# Patient Record
Sex: Male | Born: 1935 | Race: White | Hispanic: No | Smoking: Former smoker
Health system: Southern US, Community
[De-identification: ages and names within clinical notes are randomized; demographics above are authoritative.]

## PROBLEM LIST (undated history)

## (undated) DIAGNOSIS — Z950 Presence of cardiac pacemaker: Secondary | ICD-10-CM

## (undated) DIAGNOSIS — Z5189 Encounter for other specified aftercare: Secondary | ICD-10-CM

## (undated) DIAGNOSIS — N289 Disorder of kidney and ureter, unspecified: Secondary | ICD-10-CM

## (undated) DIAGNOSIS — E119 Type 2 diabetes mellitus without complications: Secondary | ICD-10-CM

## (undated) DIAGNOSIS — I10 Essential (primary) hypertension: Secondary | ICD-10-CM

## (undated) DIAGNOSIS — C801 Malignant (primary) neoplasm, unspecified: Secondary | ICD-10-CM

## (undated) DIAGNOSIS — D649 Anemia, unspecified: Secondary | ICD-10-CM

## (undated) DIAGNOSIS — E785 Hyperlipidemia, unspecified: Secondary | ICD-10-CM

## (undated) HISTORY — DX: Presence of cardiac pacemaker: Z95.0

## (undated) HISTORY — PX: PROSTATECTOMY: SHX69

## (undated) HISTORY — PX: CARDIAC VALVE REPLACEMENT: SHX585

---

## 2005-03-16 ENCOUNTER — Ambulatory Visit: Payer: Self-pay | Admitting: Gastroenterology

## 2005-03-30 ENCOUNTER — Other Ambulatory Visit: Payer: Self-pay

## 2005-03-30 ENCOUNTER — Inpatient Hospital Stay: Payer: Self-pay | Admitting: Internal Medicine

## 2005-03-31 ENCOUNTER — Inpatient Hospital Stay: Payer: Self-pay | Admitting: Gastroenterology

## 2005-04-21 ENCOUNTER — Ambulatory Visit: Payer: Self-pay | Admitting: Gastroenterology

## 2006-06-03 ENCOUNTER — Ambulatory Visit: Payer: Self-pay | Admitting: Internal Medicine

## 2006-06-23 ENCOUNTER — Ambulatory Visit: Payer: Self-pay | Admitting: Internal Medicine

## 2006-08-30 ENCOUNTER — Ambulatory Visit: Payer: Self-pay | Admitting: Internal Medicine

## 2006-09-23 ENCOUNTER — Ambulatory Visit: Payer: Self-pay | Admitting: Internal Medicine

## 2006-11-08 ENCOUNTER — Ambulatory Visit: Payer: Self-pay | Admitting: Internal Medicine

## 2006-11-22 ENCOUNTER — Ambulatory Visit: Payer: Self-pay | Admitting: Internal Medicine

## 2007-06-27 ENCOUNTER — Ambulatory Visit: Payer: Self-pay | Admitting: Internal Medicine

## 2007-07-24 ENCOUNTER — Ambulatory Visit: Payer: Self-pay | Admitting: Internal Medicine

## 2007-08-11 ENCOUNTER — Ambulatory Visit: Payer: Self-pay | Admitting: Gastroenterology

## 2007-10-22 ENCOUNTER — Ambulatory Visit: Payer: Self-pay | Admitting: Internal Medicine

## 2007-12-22 ENCOUNTER — Ambulatory Visit: Payer: Self-pay | Admitting: Internal Medicine

## 2007-12-26 ENCOUNTER — Ambulatory Visit: Payer: Self-pay | Admitting: Internal Medicine

## 2008-01-22 ENCOUNTER — Ambulatory Visit: Payer: Self-pay | Admitting: Internal Medicine

## 2008-03-23 ENCOUNTER — Ambulatory Visit: Payer: Self-pay | Admitting: Internal Medicine

## 2008-09-24 ENCOUNTER — Ambulatory Visit: Payer: Self-pay | Admitting: Internal Medicine

## 2008-10-21 ENCOUNTER — Ambulatory Visit: Payer: Self-pay | Admitting: Internal Medicine

## 2009-03-23 ENCOUNTER — Ambulatory Visit: Payer: Self-pay | Admitting: Internal Medicine

## 2009-03-25 ENCOUNTER — Ambulatory Visit: Payer: Self-pay | Admitting: Internal Medicine

## 2009-04-23 ENCOUNTER — Ambulatory Visit: Payer: Self-pay

## 2009-04-23 ENCOUNTER — Ambulatory Visit: Payer: Self-pay | Admitting: Internal Medicine

## 2009-05-14 ENCOUNTER — Ambulatory Visit: Payer: Self-pay | Admitting: Gastroenterology

## 2009-05-23 ENCOUNTER — Ambulatory Visit: Payer: Self-pay | Admitting: Internal Medicine

## 2009-06-23 ENCOUNTER — Ambulatory Visit: Payer: Self-pay | Admitting: Internal Medicine

## 2009-07-14 ENCOUNTER — Ambulatory Visit: Payer: Self-pay | Admitting: Internal Medicine

## 2009-07-23 ENCOUNTER — Ambulatory Visit: Payer: Self-pay | Admitting: Internal Medicine

## 2009-08-23 ENCOUNTER — Ambulatory Visit: Payer: Self-pay | Admitting: Internal Medicine

## 2009-09-23 ENCOUNTER — Ambulatory Visit: Payer: Self-pay | Admitting: Internal Medicine

## 2009-10-21 ENCOUNTER — Ambulatory Visit: Payer: Self-pay | Admitting: Internal Medicine

## 2009-11-21 ENCOUNTER — Ambulatory Visit: Payer: Self-pay | Admitting: Internal Medicine

## 2009-12-21 ENCOUNTER — Ambulatory Visit: Payer: Self-pay | Admitting: Internal Medicine

## 2010-01-21 ENCOUNTER — Ambulatory Visit: Payer: Self-pay | Admitting: Internal Medicine

## 2010-02-20 ENCOUNTER — Ambulatory Visit: Payer: Self-pay | Admitting: Internal Medicine

## 2010-03-23 ENCOUNTER — Ambulatory Visit: Payer: Self-pay | Admitting: Internal Medicine

## 2010-04-23 ENCOUNTER — Ambulatory Visit: Payer: Self-pay | Admitting: Internal Medicine

## 2010-05-23 ENCOUNTER — Ambulatory Visit: Payer: Self-pay | Admitting: Internal Medicine

## 2010-06-17 ENCOUNTER — Ambulatory Visit: Payer: Self-pay | Admitting: Gastroenterology

## 2010-06-23 ENCOUNTER — Ambulatory Visit: Payer: Self-pay | Admitting: Internal Medicine

## 2010-07-23 ENCOUNTER — Ambulatory Visit: Payer: Self-pay | Admitting: Internal Medicine

## 2010-07-24 ENCOUNTER — Ambulatory Visit: Payer: Self-pay | Admitting: Internal Medicine

## 2010-08-23 ENCOUNTER — Ambulatory Visit: Payer: Self-pay | Admitting: Internal Medicine

## 2010-09-23 ENCOUNTER — Ambulatory Visit: Payer: Self-pay | Admitting: Internal Medicine

## 2010-10-22 ENCOUNTER — Ambulatory Visit: Payer: Self-pay | Admitting: Internal Medicine

## 2010-11-22 ENCOUNTER — Ambulatory Visit: Payer: Self-pay | Admitting: Internal Medicine

## 2010-12-22 ENCOUNTER — Ambulatory Visit: Payer: Self-pay | Admitting: Internal Medicine

## 2011-01-22 ENCOUNTER — Ambulatory Visit: Payer: Self-pay | Admitting: Internal Medicine

## 2011-03-17 ENCOUNTER — Ambulatory Visit: Payer: Self-pay | Admitting: Internal Medicine

## 2011-03-24 ENCOUNTER — Ambulatory Visit: Payer: Self-pay | Admitting: Internal Medicine

## 2011-05-12 ENCOUNTER — Ambulatory Visit: Payer: Self-pay | Admitting: Internal Medicine

## 2011-05-24 ENCOUNTER — Ambulatory Visit: Payer: Self-pay | Admitting: Internal Medicine

## 2011-06-08 ENCOUNTER — Observation Stay: Payer: Self-pay | Admitting: Internal Medicine

## 2011-07-07 ENCOUNTER — Ambulatory Visit: Payer: Self-pay | Admitting: Internal Medicine

## 2011-07-24 ENCOUNTER — Ambulatory Visit: Payer: Self-pay | Admitting: Internal Medicine

## 2011-09-01 ENCOUNTER — Ambulatory Visit: Payer: Self-pay | Admitting: Internal Medicine

## 2011-09-01 LAB — CBC CANCER CENTER
Basophil #: 0 x10 3/mm (ref 0.0–0.1)
Eosinophil #: 0.2 x10 3/mm (ref 0.0–0.7)
Eosinophil %: 3.8 %
Lymphocyte %: 22.4 %
MCHC: 32.6 g/dL (ref 32.0–36.0)
MCV: 88 fL (ref 80–100)
Neutrophil %: 64 %
RBC: 3.31 10*6/uL — ABNORMAL LOW (ref 4.40–5.90)
RDW: 15 % — ABNORMAL HIGH (ref 11.5–14.5)

## 2011-09-01 LAB — IRON AND TIBC
Iron Bind.Cap.(Total): 345 ug/dL (ref 250–450)
Iron: 47 ug/dL — ABNORMAL LOW (ref 65–175)
Unbound Iron-Bind.Cap.: 298 ug/dL

## 2011-09-24 ENCOUNTER — Ambulatory Visit: Payer: Self-pay | Admitting: Internal Medicine

## 2011-12-27 ENCOUNTER — Ambulatory Visit: Payer: Self-pay | Admitting: Internal Medicine

## 2011-12-27 LAB — CBC CANCER CENTER
Basophil #: 0 x10 3/mm (ref 0.0–0.1)
Basophil %: 0.9 %
Eosinophil #: 0.3 x10 3/mm (ref 0.0–0.7)
Eosinophil %: 6.9 %
HCT: 29 % — ABNORMAL LOW (ref 40.0–52.0)
Lymphocyte #: 0.6 x10 3/mm — ABNORMAL LOW (ref 1.0–3.6)
MCHC: 32.1 g/dL (ref 32.0–36.0)
MCV: 91 fL (ref 80–100)
Monocyte %: 9.3 %
Platelet: 100 x10 3/mm — ABNORMAL LOW (ref 150–440)
RBC: 3.19 10*6/uL — ABNORMAL LOW (ref 4.40–5.90)
RDW: 15.6 % — ABNORMAL HIGH (ref 11.5–14.5)
WBC: 4 x10 3/mm (ref 3.8–10.6)

## 2011-12-27 LAB — IRON AND TIBC
Iron Bind.Cap.(Total): 313 ug/dL (ref 250–450)
Iron Saturation: 22 %
Iron: 68 ug/dL (ref 65–175)

## 2011-12-27 LAB — FERRITIN: Ferritin (ARMC): 23 ng/mL (ref 8–388)

## 2012-01-22 ENCOUNTER — Ambulatory Visit: Payer: Self-pay | Admitting: Internal Medicine

## 2012-02-21 ENCOUNTER — Ambulatory Visit: Payer: Self-pay | Admitting: Internal Medicine

## 2012-02-21 LAB — CANCER CENTER HEMOGLOBIN: HGB: 9.1 g/dL — ABNORMAL LOW (ref 13.0–18.0)

## 2012-03-23 ENCOUNTER — Ambulatory Visit: Payer: Self-pay | Admitting: Internal Medicine

## 2012-04-20 LAB — CANCER CENTER HEMOGLOBIN: HGB: 8.2 g/dL — ABNORMAL LOW (ref 13.0–18.0)

## 2012-04-23 ENCOUNTER — Ambulatory Visit: Payer: Self-pay | Admitting: Internal Medicine

## 2012-04-25 LAB — IRON AND TIBC
Iron Bind.Cap.(Total): 380 ug/dL (ref 250–450)
Iron: 39 ug/dL — ABNORMAL LOW (ref 65–175)

## 2012-04-25 LAB — FERRITIN: Ferritin (ARMC): 6 ng/mL — ABNORMAL LOW (ref 8–388)

## 2012-04-25 LAB — RETICULOCYTES
Absolute Retic Count: 0.0673 10*6/uL (ref 0.031–0.129)
Reticulocyte: 2.04 % (ref 0.7–2.5)

## 2012-05-11 ENCOUNTER — Ambulatory Visit: Payer: Self-pay | Admitting: Gastroenterology

## 2012-05-11 LAB — HEPATIC FUNCTION PANEL A (ARMC)
Alkaline Phosphatase: 41 U/L — ABNORMAL LOW (ref 50–136)
Bilirubin, Direct: 0.2 mg/dL (ref 0.00–0.20)
SGOT(AST): 25 U/L (ref 15–37)

## 2012-05-15 LAB — PATHOLOGY REPORT

## 2012-05-23 ENCOUNTER — Ambulatory Visit: Payer: Self-pay | Admitting: Internal Medicine

## 2012-06-23 ENCOUNTER — Ambulatory Visit: Payer: Self-pay | Admitting: Internal Medicine

## 2012-08-25 ENCOUNTER — Ambulatory Visit: Payer: Self-pay | Admitting: Internal Medicine

## 2012-08-25 LAB — CBC CANCER CENTER
Basophil %: 1.1 %
Eosinophil %: 6.8 %
HCT: 23.8 % — ABNORMAL LOW (ref 40.0–52.0)
Lymphocyte #: 1.1 x10 3/mm (ref 1.0–3.6)
Lymphocyte %: 20.3 %
MCHC: 31.4 g/dL — ABNORMAL LOW (ref 32.0–36.0)
MCV: 79 fL — ABNORMAL LOW (ref 80–100)
Monocyte #: 0.4 x10 3/mm (ref 0.2–1.0)
Neutrophil #: 3.3 x10 3/mm (ref 1.4–6.5)
Neutrophil %: 63.4 %
Platelet: 162 x10 3/mm (ref 150–440)
RDW: 16.1 % — ABNORMAL HIGH (ref 11.5–14.5)
WBC: 5.2 x10 3/mm (ref 3.8–10.6)

## 2012-08-25 LAB — IRON AND TIBC
Iron Bind.Cap.(Total): 413 ug/dL (ref 250–450)
Iron: 20 ug/dL — ABNORMAL LOW (ref 65–175)

## 2012-09-23 ENCOUNTER — Ambulatory Visit: Payer: Self-pay | Admitting: Internal Medicine

## 2012-10-26 ENCOUNTER — Ambulatory Visit: Payer: Self-pay | Admitting: Internal Medicine

## 2012-11-21 ENCOUNTER — Ambulatory Visit: Payer: Self-pay | Admitting: Internal Medicine

## 2012-12-11 LAB — CBC CANCER CENTER
Basophil #: 0 x10 3/mm (ref 0.0–0.1)
Basophil %: 1.2 %
Eosinophil #: 0.2 x10 3/mm (ref 0.0–0.7)
Eosinophil %: 4.4 %
HCT: 32 % — ABNORMAL LOW (ref 40.0–52.0)
HGB: 10.5 g/dL — ABNORMAL LOW (ref 13.0–18.0)
Lymphocyte #: 0.8 x10 3/mm — ABNORMAL LOW (ref 1.0–3.6)
Lymphocyte %: 20.5 %
MCH: 28.2 pg (ref 26.0–34.0)
MCHC: 32.8 g/dL (ref 32.0–36.0)
Monocyte #: 0.4 x10 3/mm (ref 0.2–1.0)
Monocyte %: 9.6 %
Neutrophil %: 64.3 %
RBC: 3.73 10*6/uL — ABNORMAL LOW (ref 4.40–5.90)
RDW: 16.5 % — ABNORMAL HIGH (ref 11.5–14.5)
WBC: 3.7 x10 3/mm — ABNORMAL LOW (ref 3.8–10.6)

## 2012-12-11 LAB — IRON AND TIBC
Iron Bind.Cap.(Total): 350 ug/dL (ref 250–450)
Iron: 45 ug/dL — ABNORMAL LOW (ref 65–175)
Unbound Iron-Bind.Cap.: 305 ug/dL

## 2012-12-11 LAB — FERRITIN: Ferritin (ARMC): 12 ng/mL (ref 8–388)

## 2012-12-21 ENCOUNTER — Ambulatory Visit: Payer: Self-pay | Admitting: Internal Medicine

## 2013-02-01 ENCOUNTER — Inpatient Hospital Stay: Payer: Self-pay | Admitting: Internal Medicine

## 2013-02-01 LAB — URINALYSIS, COMPLETE
Bilirubin,UR: NEGATIVE
Hyaline Cast: 1
Ketone: NEGATIVE
Ph: 5 (ref 4.5–8.0)
Protein: 30
RBC,UR: 3 /HPF (ref 0–5)
Specific Gravity: 1.013 (ref 1.003–1.030)
Squamous Epithelial: 2
WBC UR: 20 /HPF (ref 0–5)

## 2013-02-01 LAB — COMPREHENSIVE METABOLIC PANEL
Albumin: 3 g/dL — ABNORMAL LOW (ref 3.4–5.0)
Alkaline Phosphatase: 68 U/L (ref 50–136)
BUN: 52 mg/dL — ABNORMAL HIGH (ref 7–18)
Bilirubin,Total: 0.3 mg/dL (ref 0.2–1.0)
Calcium, Total: 8.8 mg/dL (ref 8.5–10.1)
Creatinine: 4.22 mg/dL — ABNORMAL HIGH (ref 0.60–1.30)
SGOT(AST): 26 U/L (ref 15–37)
SGPT (ALT): 24 U/L (ref 12–78)
Sodium: 138 mmol/L (ref 136–145)

## 2013-02-01 LAB — CBC
HCT: 35.6 % — ABNORMAL LOW (ref 40.0–52.0)
MCH: 29.9 pg (ref 26.0–34.0)
MCHC: 33.2 g/dL (ref 32.0–36.0)
RBC: 3.96 10*6/uL — ABNORMAL LOW (ref 4.40–5.90)

## 2013-02-01 LAB — LIPASE, BLOOD: Lipase: 185 U/L (ref 73–393)

## 2013-02-01 LAB — HEMOGLOBIN A1C: Hemoglobin A1C: 6.9 % — ABNORMAL HIGH (ref 4.2–6.3)

## 2013-02-01 LAB — TSH: Thyroid Stimulating Horm: 3.27 u[IU]/mL

## 2013-02-02 LAB — CBC WITH DIFFERENTIAL/PLATELET
Eosinophil: 8 %
HGB: 10.3 g/dL — ABNORMAL LOW (ref 13.0–18.0)
Lymphocytes: 16 %
MCH: 30 pg (ref 26.0–34.0)
MCHC: 33.5 g/dL (ref 32.0–36.0)
MCV: 90 fL (ref 80–100)
Platelet: 106 10*3/uL — ABNORMAL LOW (ref 150–440)
RBC: 3.44 10*6/uL — ABNORMAL LOW (ref 4.40–5.90)
Segmented Neutrophils: 62 %

## 2013-02-02 LAB — BASIC METABOLIC PANEL
Calcium, Total: 7.6 mg/dL — ABNORMAL LOW (ref 8.5–10.1)
Chloride: 114 mmol/L — ABNORMAL HIGH (ref 98–107)
Creatinine: 2.43 mg/dL — ABNORMAL HIGH (ref 0.60–1.30)
EGFR (African American): 28 — ABNORMAL LOW
EGFR (Non-African Amer.): 24 — ABNORMAL LOW
Glucose: 78 mg/dL (ref 65–99)
Osmolality: 289 (ref 275–301)
Sodium: 141 mmol/L (ref 136–145)

## 2013-02-03 LAB — CBC WITH DIFFERENTIAL/PLATELET
Basophil #: 0 x10 3/mm 3
Basophil %: 0.1 %
Eosinophil #: 0 x10 3/mm 3
Eosinophil %: 0 %
HCT: 29.9 % — ABNORMAL LOW
HGB: 10.2 g/dL — ABNORMAL LOW
Lymphocyte %: 11.9 %
Lymphs Abs: 1 x10 3/mm 3
MCH: 30 pg
MCHC: 34 g/dL
MCV: 88 fL
Monocyte #: 0.9 "x10 3/mm "
Monocyte %: 11.2 %
Neutrophil #: 6.2 x10 3/mm 3
Neutrophil %: 76.8 %
Platelet: 101 x10 3/mm 3 — ABNORMAL LOW
RBC: 3.39 x10 6/mm 3 — ABNORMAL LOW
RDW: 15.7 % — ABNORMAL HIGH
WBC: 7.8 x10 3/mm 3

## 2013-02-03 LAB — BASIC METABOLIC PANEL WITH GFR
Anion Gap: 8
BUN: 31 mg/dL — ABNORMAL HIGH
Calcium, Total: 7.5 mg/dL — ABNORMAL LOW
Chloride: 109 mmol/L — ABNORMAL HIGH
Co2: 22 mmol/L
Creatinine: 1.85 mg/dL — ABNORMAL HIGH
EGFR (African American): 39 — ABNORMAL LOW
EGFR (Non-African Amer.): 33 — ABNORMAL LOW
Glucose: 97 mg/dL
Osmolality: 284
Potassium: 3.3 mmol/L — ABNORMAL LOW
Sodium: 139 mmol/L

## 2013-02-03 LAB — STOOL CULTURE

## 2013-02-04 LAB — BASIC METABOLIC PANEL
Anion Gap: 7 (ref 7–16)
BUN: 28 mg/dL — ABNORMAL HIGH (ref 7–18)
Chloride: 113 mmol/L — ABNORMAL HIGH (ref 98–107)
Co2: 23 mmol/L (ref 21–32)
EGFR (African American): 45 — ABNORMAL LOW
EGFR (Non-African Amer.): 39 — ABNORMAL LOW
Glucose: 117 mg/dL — ABNORMAL HIGH (ref 65–99)
Osmolality: 291 (ref 275–301)
Potassium: 4.2 mmol/L (ref 3.5–5.1)
Sodium: 143 mmol/L (ref 136–145)

## 2013-02-04 LAB — URINE CULTURE

## 2013-02-20 ENCOUNTER — Ambulatory Visit: Payer: Self-pay | Admitting: Internal Medicine

## 2013-05-08 ENCOUNTER — Ambulatory Visit: Payer: Self-pay | Admitting: Internal Medicine

## 2013-05-08 LAB — CBC CANCER CENTER
Basophil #: 0 x10 3/mm (ref 0.0–0.1)
Basophil %: 1 %
HGB: 6.6 g/dL — ABNORMAL LOW (ref 13.0–18.0)
Lymphocyte #: 0.6 x10 3/mm — ABNORMAL LOW (ref 1.0–3.6)
MCV: 75 fL — ABNORMAL LOW (ref 80–100)
Monocyte #: 0.4 x10 3/mm (ref 0.2–1.0)
Monocyte %: 8.2 %
Neutrophil #: 3.9 x10 3/mm (ref 1.4–6.5)
Neutrophil %: 76.6 %
Platelet: 175 x10 3/mm (ref 150–440)
RBC: 2.82 10*6/uL — ABNORMAL LOW (ref 4.40–5.90)
RDW: 16.7 % — ABNORMAL HIGH (ref 11.5–14.5)
WBC: 5.1 x10 3/mm (ref 3.8–10.6)

## 2013-05-08 LAB — IRON AND TIBC
Iron Bind.Cap.(Total): 390 ug/dL (ref 250–450)
Iron Saturation: 21 %

## 2013-05-08 LAB — FERRITIN: Ferritin (ARMC): 4 ng/mL — ABNORMAL LOW (ref 8–388)

## 2013-05-16 LAB — CANCER CENTER HEMOGLOBIN: HGB: 8.4 g/dL — ABNORMAL LOW (ref 13.0–18.0)

## 2013-05-23 ENCOUNTER — Ambulatory Visit: Payer: Self-pay | Admitting: Internal Medicine

## 2013-05-24 ENCOUNTER — Ambulatory Visit: Payer: Self-pay | Admitting: Gastroenterology

## 2013-06-23 ENCOUNTER — Ambulatory Visit: Payer: Self-pay | Admitting: Internal Medicine

## 2013-07-11 LAB — IRON AND TIBC
Iron Saturation: 6 %
Unbound Iron-Bind.Cap.: 377 ug/dL

## 2013-07-11 LAB — FERRITIN: Ferritin (ARMC): 5 ng/mL — ABNORMAL LOW (ref 8–388)

## 2013-07-11 LAB — CBC CANCER CENTER
Basophil #: 0.1 x10 3/mm (ref 0.0–0.1)
Eosinophil %: 3.4 %
HCT: 22.8 % — ABNORMAL LOW (ref 40.0–52.0)
HGB: 7 g/dL — ABNORMAL LOW (ref 13.0–18.0)
Lymphocyte #: 0.5 x10 3/mm — ABNORMAL LOW (ref 1.0–3.6)
Lymphocyte %: 12.1 %
MCH: 23.7 pg — ABNORMAL LOW (ref 26.0–34.0)
MCHC: 30.9 g/dL — ABNORMAL LOW (ref 32.0–36.0)
Monocyte %: 7.9 %
Neutrophil #: 3.4 x10 3/mm (ref 1.4–6.5)
Neutrophil %: 75.3 %
Platelet: 143 x10 3/mm — ABNORMAL LOW (ref 150–440)
RBC: 2.97 10*6/uL — ABNORMAL LOW (ref 4.40–5.90)
RDW: 19.4 % — ABNORMAL HIGH (ref 11.5–14.5)

## 2013-07-12 ENCOUNTER — Ambulatory Visit: Payer: Self-pay | Admitting: Cardiology

## 2013-07-12 LAB — CBC WITH DIFFERENTIAL/PLATELET
Basophil #: 0 10*3/uL (ref 0.0–0.1)
Eosinophil #: 0.2 10*3/uL (ref 0.0–0.7)
Eosinophil %: 5.9 %
MCH: 25 pg — ABNORMAL LOW (ref 26.0–34.0)
MCHC: 32.5 g/dL (ref 32.0–36.0)
Monocyte %: 10.3 %
Platelet: 114 10*3/uL — ABNORMAL LOW (ref 150–440)
RBC: 3.49 10*6/uL — ABNORMAL LOW (ref 4.40–5.90)
WBC: 3.5 10*3/uL — ABNORMAL LOW (ref 3.8–10.6)

## 2013-07-12 LAB — BASIC METABOLIC PANEL
Anion Gap: 5 — ABNORMAL LOW (ref 7–16)
BUN: 30 mg/dL — ABNORMAL HIGH (ref 7–18)
Chloride: 110 mmol/L — ABNORMAL HIGH (ref 98–107)
Co2: 24 mmol/L (ref 21–32)
Creatinine: 1.38 mg/dL — ABNORMAL HIGH (ref 0.60–1.30)
Glucose: 207 mg/dL — ABNORMAL HIGH (ref 65–99)
Osmolality: 290 (ref 275–301)
Potassium: 4.3 mmol/L (ref 3.5–5.1)
Sodium: 139 mmol/L (ref 136–145)

## 2013-07-12 LAB — PROTIME-INR
INR: 1.1
Prothrombin Time: 14.3 secs (ref 11.5–14.7)

## 2013-07-16 ENCOUNTER — Ambulatory Visit: Payer: Self-pay | Admitting: Gastroenterology

## 2013-07-23 ENCOUNTER — Ambulatory Visit: Payer: Self-pay | Admitting: Internal Medicine

## 2013-07-24 LAB — CANCER CENTER HEMOGLOBIN: HGB: 8.6 g/dL — ABNORMAL LOW (ref 13.0–18.0)

## 2013-08-23 ENCOUNTER — Ambulatory Visit: Payer: Self-pay | Admitting: Internal Medicine

## 2013-09-18 LAB — CANCER CENTER HEMOGLOBIN: HGB: 9.3 g/dL — ABNORMAL LOW (ref 13.0–18.0)

## 2013-09-19 LAB — IRON AND TIBC
IRON BIND. CAP.(TOTAL): 373 ug/dL (ref 250–450)
IRON: 28 ug/dL — AB (ref 65–175)
Iron Saturation: 8 %
UNBOUND IRON-BIND. CAP.: 345 ug/dL

## 2013-09-23 ENCOUNTER — Ambulatory Visit: Payer: Self-pay | Admitting: Internal Medicine

## 2013-11-09 ENCOUNTER — Ambulatory Visit: Payer: Self-pay | Admitting: Internal Medicine

## 2013-11-12 LAB — IRON AND TIBC
IRON BIND. CAP.(TOTAL): 295 ug/dL (ref 250–450)
IRON: 22 ug/dL — AB (ref 65–175)
Iron Saturation: 7 %
UNBOUND IRON-BIND. CAP.: 273 ug/dL

## 2013-11-12 LAB — CBC CANCER CENTER
Basophil #: 0.1 x10 3/mm (ref 0.0–0.1)
Basophil %: 1.5 %
EOS ABS: 0.3 x10 3/mm (ref 0.0–0.7)
Eosinophil %: 8.3 %
HCT: 23.9 % — ABNORMAL LOW (ref 40.0–52.0)
HGB: 7.5 g/dL — AB (ref 13.0–18.0)
Lymphocyte #: 0.4 x10 3/mm — ABNORMAL LOW (ref 1.0–3.6)
Lymphocyte %: 9.5 %
MCH: 28.5 pg (ref 26.0–34.0)
MCHC: 31.2 g/dL — AB (ref 32.0–36.0)
MCV: 92 fL (ref 80–100)
Monocyte #: 0.3 x10 3/mm (ref 0.2–1.0)
Monocyte %: 7.6 %
NEUTROS PCT: 73.1 %
Neutrophil #: 3 x10 3/mm (ref 1.4–6.5)
PLATELETS: 107 x10 3/mm — AB (ref 150–440)
RBC: 2.61 10*6/uL — ABNORMAL LOW (ref 4.40–5.90)
RDW: 15.4 % — AB (ref 11.5–14.5)
WBC: 4.1 x10 3/mm (ref 3.8–10.6)

## 2013-11-12 LAB — FERRITIN: Ferritin (ARMC): 24 ng/mL (ref 8–388)

## 2013-11-21 ENCOUNTER — Ambulatory Visit: Payer: Self-pay | Admitting: Internal Medicine

## 2013-12-05 ENCOUNTER — Inpatient Hospital Stay: Payer: Self-pay

## 2013-12-05 LAB — CBC
HCT: 24.1 % — ABNORMAL LOW (ref 40.0–52.0)
HGB: 7.5 g/dL — ABNORMAL LOW (ref 13.0–18.0)
MCH: 29.5 pg (ref 26.0–34.0)
MCHC: 31.3 g/dL — ABNORMAL LOW (ref 32.0–36.0)
MCV: 94 fL (ref 80–100)
Platelet: 92 10*3/uL — ABNORMAL LOW (ref 150–440)
RBC: 2.56 10*6/uL — ABNORMAL LOW (ref 4.40–5.90)
RDW: 16.6 % — AB (ref 11.5–14.5)
WBC: 4.2 10*3/uL (ref 3.8–10.6)

## 2013-12-05 LAB — URINALYSIS, COMPLETE
BILIRUBIN, UR: NEGATIVE
Bacteria: NONE SEEN
Blood: NEGATIVE
Glucose,UR: NEGATIVE mg/dL (ref 0–75)
Ketone: NEGATIVE
Leukocyte Esterase: NEGATIVE
NITRITE: NEGATIVE
PH: 5 (ref 4.5–8.0)
Protein: 30
RBC,UR: 1 /HPF (ref 0–5)
Specific Gravity: 1.013 (ref 1.003–1.030)
WBC UR: <1 /HPF (ref 0–5)

## 2013-12-05 LAB — COMPREHENSIVE METABOLIC PANEL
ALBUMIN: 3.2 g/dL — AB (ref 3.4–5.0)
ALK PHOS: 48 U/L
ALT: 17 U/L (ref 12–78)
Anion Gap: 8 (ref 7–16)
BUN: 34 mg/dL — ABNORMAL HIGH (ref 7–18)
Bilirubin,Total: 0.5 mg/dL (ref 0.2–1.0)
CHLORIDE: 111 mmol/L — AB (ref 98–107)
CO2: 23 mmol/L (ref 21–32)
Calcium, Total: 8.4 mg/dL — ABNORMAL LOW (ref 8.5–10.1)
Creatinine: 1.57 mg/dL — ABNORMAL HIGH (ref 0.60–1.30)
EGFR (African American): 47 — ABNORMAL LOW
EGFR (Non-African Amer.): 40 — ABNORMAL LOW
Glucose: 217 mg/dL — ABNORMAL HIGH (ref 65–99)
Osmolality: 297 (ref 275–301)
Potassium: 4.3 mmol/L (ref 3.5–5.1)
SGOT(AST): 21 U/L (ref 15–37)
SODIUM: 142 mmol/L (ref 136–145)
Total Protein: 5.5 g/dL — ABNORMAL LOW (ref 6.4–8.2)

## 2013-12-05 LAB — LIPASE, BLOOD: Lipase: 167 U/L (ref 73–393)

## 2013-12-05 LAB — PROTIME-INR
INR: 1.1
Prothrombin Time: 14.1 secs (ref 11.5–14.7)

## 2013-12-05 LAB — HEMOGLOBIN: HGB: 8.1 g/dL — ABNORMAL LOW (ref 13.0–18.0)

## 2013-12-06 LAB — BASIC METABOLIC PANEL
ANION GAP: 7 (ref 7–16)
BUN: 21 mg/dL — AB (ref 7–18)
Calcium, Total: 8.1 mg/dL — ABNORMAL LOW (ref 8.5–10.1)
Chloride: 118 mmol/L — ABNORMAL HIGH (ref 98–107)
Co2: 21 mmol/L (ref 21–32)
Creatinine: 1.22 mg/dL (ref 0.60–1.30)
EGFR (African American): >60
GFR CALC NON AF AMER: 55 — AB
Glucose: 125 mg/dL — ABNORMAL HIGH (ref 65–99)
Osmolality: 295 (ref 275–301)
Potassium: 3.8 mmol/L (ref 3.5–5.1)
Sodium: 146 mmol/L — ABNORMAL HIGH (ref 136–145)

## 2013-12-06 LAB — CBC WITH DIFFERENTIAL/PLATELET
BASOS ABS: 0 10*3/uL (ref 0.0–0.1)
Basophil %: 1.4 %
EOS PCT: 7 %
Eosinophil #: 0.2 10*3/uL (ref 0.0–0.7)
HCT: 23.7 % — ABNORMAL LOW (ref 40.0–52.0)
HGB: 7.6 g/dL — ABNORMAL LOW (ref 13.0–18.0)
Lymphocyte #: 0.6 10*3/uL — ABNORMAL LOW (ref 1.0–3.6)
Lymphocyte %: 19.3 %
MCH: 30.4 pg (ref 26.0–34.0)
MCHC: 32.2 g/dL (ref 32.0–36.0)
MCV: 94 fL (ref 80–100)
MONOS PCT: 12.3 %
Monocyte #: 0.4 x10 3/mm (ref 0.2–1.0)
NEUTROS PCT: 60 %
Neutrophil #: 1.9 10*3/uL (ref 1.4–6.5)
PLATELETS: 81 10*3/uL — AB (ref 150–440)
RBC: 2.52 10*6/uL — ABNORMAL LOW (ref 4.40–5.90)
RDW: 16.3 % — ABNORMAL HIGH (ref 11.5–14.5)
WBC: 3.1 10*3/uL — AB (ref 3.8–10.6)

## 2013-12-06 LAB — HEMOGLOBIN
HGB: 7.4 g/dL — ABNORMAL LOW (ref 13.0–18.0)
HGB: 7.9 g/dL — AB (ref 13.0–18.0)

## 2013-12-07 LAB — BASIC METABOLIC PANEL
Anion Gap: 6 — ABNORMAL LOW (ref 7–16)
BUN: 14 mg/dL (ref 7–18)
CALCIUM: 8.8 mg/dL (ref 8.5–10.1)
CHLORIDE: 112 mmol/L — AB (ref 98–107)
Co2: 25 mmol/L (ref 21–32)
Creatinine: 1.2 mg/dL (ref 0.60–1.30)
EGFR (African American): >60
EGFR (Non-African Amer.): 56 — ABNORMAL LOW
Glucose: 147 mg/dL — ABNORMAL HIGH (ref 65–99)
Osmolality: 288 (ref 275–301)
Potassium: 4 mmol/L (ref 3.5–5.1)
SODIUM: 143 mmol/L (ref 136–145)

## 2013-12-07 LAB — CBC WITH DIFFERENTIAL/PLATELET
BASOS PCT: 1.4 %
Basophil #: 0.1 10*3/uL (ref 0.0–0.1)
Eosinophil #: 0.1 10*3/uL (ref 0.0–0.7)
Eosinophil %: 3.6 %
HCT: 28.1 % — ABNORMAL LOW (ref 40.0–52.0)
HGB: 9.1 g/dL — ABNORMAL LOW (ref 13.0–18.0)
LYMPHS ABS: 0.7 10*3/uL — AB (ref 1.0–3.6)
LYMPHS PCT: 17.9 %
MCH: 30.5 pg (ref 26.0–34.0)
MCHC: 32.4 g/dL (ref 32.0–36.0)
MCV: 94 fL (ref 80–100)
MONOS PCT: 11 %
Monocyte #: 0.4 x10 3/mm (ref 0.2–1.0)
Neutrophil #: 2.4 10*3/uL (ref 1.4–6.5)
Neutrophil %: 66.1 %
PLATELETS: 102 10*3/uL — AB (ref 150–440)
RBC: 2.98 10*6/uL — AB (ref 4.40–5.90)
RDW: 16.2 % — ABNORMAL HIGH (ref 11.5–14.5)
WBC: 3.6 10*3/uL — ABNORMAL LOW (ref 3.8–10.6)

## 2013-12-08 LAB — CBC WITH DIFFERENTIAL/PLATELET
BASOS ABS: 0 10*3/uL (ref 0.0–0.1)
BASOS PCT: 1 %
Basophil #: 0 10*3/uL (ref 0.0–0.1)
Basophil %: 1 %
EOS ABS: 0.2 10*3/uL (ref 0.0–0.7)
Eosinophil #: 0.2 10*3/uL (ref 0.0–0.7)
Eosinophil %: 6.8 %
Eosinophil %: 4.9 %
HCT: 24.1 % — ABNORMAL LOW (ref 40.0–52.0)
HCT: 24.5 % — ABNORMAL LOW (ref 40.0–52.0)
HGB: 7.7 g/dL — AB (ref 13.0–18.0)
HGB: 7.8 g/dL — ABNORMAL LOW (ref 13.0–18.0)
LYMPHS PCT: 19.1 %
Lymphocyte #: 0.6 10*3/uL — ABNORMAL LOW (ref 1.0–3.6)
Lymphocyte #: 0.4 10*3/uL — ABNORMAL LOW (ref 1.0–3.6)
Lymphocyte %: 13.6 %
MCH: 30 pg (ref 26.0–34.0)
MCH: 30 pg (ref 26.0–34.0)
MCHC: 31.7 g/dL — ABNORMAL LOW (ref 32.0–36.0)
MCHC: 31.8 g/dL — AB (ref 32.0–36.0)
MCV: 94 fL (ref 80–100)
MCV: 94 fL (ref 80–100)
MONOS PCT: 11.1 %
Monocyte #: 0.3 x10 3/mm (ref 0.2–1.0)
Monocyte #: 0.3 x10 3/mm (ref 0.2–1.0)
Monocyte %: 10.8 %
Neutrophil #: 1.8 10*3/uL (ref 1.4–6.5)
Neutrophil #: 2.2 10*3/uL (ref 1.4–6.5)
Neutrophil %: 62.3 %
Neutrophil %: 69.4 %
Platelet: 80 10*3/uL — ABNORMAL LOW (ref 150–440)
Platelet: 79 10*3/uL — ABNORMAL LOW (ref 150–440)
RBC: 2.55 10*6/uL — ABNORMAL LOW (ref 4.40–5.90)
RBC: 2.6 10*6/uL — AB (ref 4.40–5.90)
RDW: 15.6 % — ABNORMAL HIGH (ref 11.5–14.5)
RDW: 16 % — ABNORMAL HIGH (ref 11.5–14.5)
WBC: 2.9 10*3/uL — AB (ref 3.8–10.6)
WBC: 3.2 10*3/uL — ABNORMAL LOW (ref 3.8–10.6)

## 2013-12-08 LAB — BASIC METABOLIC PANEL
Anion Gap: 7 (ref 7–16)
BUN: 14 mg/dL (ref 7–18)
CO2: 24 mmol/L (ref 21–32)
Calcium, Total: 8 mg/dL — ABNORMAL LOW (ref 8.5–10.1)
Chloride: 114 mmol/L — ABNORMAL HIGH (ref 98–107)
Creatinine: 1.06 mg/dL (ref 0.60–1.30)
EGFR (African American): >60
EGFR (Non-African Amer.): >60
Glucose: 115 mg/dL — ABNORMAL HIGH (ref 65–99)
OSMOLALITY: 290 (ref 275–301)
POTASSIUM: 3.9 mmol/L (ref 3.5–5.1)
SODIUM: 145 mmol/L (ref 136–145)

## 2013-12-09 LAB — CBC WITH DIFFERENTIAL/PLATELET
Basophil #: 0 10*3/uL (ref 0.0–0.1)
Basophil %: 1.1 %
Eosinophil #: 0.2 10*3/uL (ref 0.0–0.7)
Eosinophil %: 6.4 %
HCT: 25.3 % — ABNORMAL LOW (ref 40.0–52.0)
HGB: 8.1 g/dL — AB (ref 13.0–18.0)
LYMPHS ABS: 0.6 10*3/uL — AB (ref 1.0–3.6)
LYMPHS PCT: 17.6 %
MCH: 29.7 pg (ref 26.0–34.0)
MCHC: 31.8 g/dL — AB (ref 32.0–36.0)
MCV: 93 fL (ref 80–100)
MONO ABS: 0.4 x10 3/mm (ref 0.2–1.0)
Monocyte %: 11.1 %
NEUTROS ABS: 2.2 10*3/uL (ref 1.4–6.5)
Neutrophil %: 63.8 %
Platelet: 84 10*3/uL — ABNORMAL LOW (ref 150–440)
RBC: 2.71 10*6/uL — ABNORMAL LOW (ref 4.40–5.90)
RDW: 15.9 % — AB (ref 11.5–14.5)
WBC: 3.4 10*3/uL — AB (ref 3.8–10.6)

## 2013-12-21 ENCOUNTER — Ambulatory Visit: Payer: Self-pay | Admitting: Internal Medicine

## 2013-12-24 LAB — CANCER CENTER HEMOGLOBIN: HGB: 8.2 g/dL — ABNORMAL LOW (ref 13.0–18.0)

## 2014-01-21 ENCOUNTER — Ambulatory Visit: Payer: Self-pay | Admitting: Internal Medicine

## 2014-03-11 ENCOUNTER — Ambulatory Visit: Payer: Self-pay | Admitting: Internal Medicine

## 2014-03-11 LAB — FERRITIN: Ferritin (ARMC): 16 ng/mL (ref 8–388)

## 2014-03-11 LAB — CBC CANCER CENTER
BASOS ABS: 0.1 x10 3/mm (ref 0.0–0.1)
Basophil %: 1.3 %
EOS ABS: 0.4 x10 3/mm (ref 0.0–0.7)
Eosinophil %: 9 %
HCT: 32.6 % — ABNORMAL LOW (ref 40.0–52.0)
HGB: 10.3 g/dL — AB (ref 13.0–18.0)
LYMPHS ABS: 0.6 x10 3/mm — AB (ref 1.0–3.6)
Lymphocyte %: 14.7 %
MCH: 27.7 pg (ref 26.0–34.0)
MCHC: 31.7 g/dL — ABNORMAL LOW (ref 32.0–36.0)
MCV: 88 fL (ref 80–100)
MONO ABS: 0.3 x10 3/mm (ref 0.2–1.0)
Monocyte %: 6.5 %
NEUTROS PCT: 68.5 %
Neutrophil #: 3 x10 3/mm (ref 1.4–6.5)
Platelet: 117 x10 3/mm — ABNORMAL LOW (ref 150–440)
RBC: 3.72 10*6/uL — AB (ref 4.40–5.90)
RDW: 14.9 % — AB (ref 11.5–14.5)
WBC: 4.3 x10 3/mm (ref 3.8–10.6)

## 2014-03-11 LAB — IRON AND TIBC
IRON: 48 ug/dL — AB (ref 65–175)
Iron Bind.Cap.(Total): 337 ug/dL (ref 250–450)
Iron Saturation: 14 %
UNBOUND IRON-BIND. CAP.: 289 ug/dL

## 2014-03-23 ENCOUNTER — Ambulatory Visit: Payer: Self-pay | Admitting: Internal Medicine

## 2014-06-18 ENCOUNTER — Ambulatory Visit: Payer: Self-pay | Admitting: Internal Medicine

## 2014-06-18 LAB — IRON AND TIBC
IRON SATURATION: 28 %
IRON: 78 ug/dL (ref 65–175)
Iron Bind.Cap.(Total): 282 ug/dL (ref 250–450)
Unbound Iron-Bind.Cap.: 204 ug/dL

## 2014-06-18 LAB — CANCER CENTER HEMOGLOBIN: HGB: 11.3 g/dL — AB (ref 13.0–18.0)

## 2014-06-18 LAB — FERRITIN: Ferritin (ARMC): 35 ng/mL (ref 8–388)

## 2014-06-23 ENCOUNTER — Ambulatory Visit: Payer: Self-pay | Admitting: Internal Medicine

## 2014-10-15 ENCOUNTER — Ambulatory Visit: Payer: Self-pay | Admitting: Internal Medicine

## 2014-10-22 ENCOUNTER — Ambulatory Visit
Admit: 2014-10-22 | Disposition: A | Discharge status: Home / Self Care | Payer: Self-pay | Attending: Internal Medicine | Admitting: Internal Medicine

## 2014-11-22 ENCOUNTER — Ambulatory Visit
Admit: 2014-11-22 | Disposition: A | Discharge status: Home / Self Care | Payer: Self-pay | Attending: Internal Medicine | Admitting: Internal Medicine

## 2014-12-13 NOTE — Consult Note (Signed)
PATIENT NAME:  Colin Harper, CEFALU MR#:  [id number] DATE OF BIRTH:  03-20-1930  DATE OF CONSULTATION:  02/01/2013  REFERRING PHYSICIAN:  Gladstone Lighter, MD CONSULTING PHYSICIAN:  Idris Edmundson Lilian Kapur, MD  REASON FOR CONSULTATION: Acute renal failure and metabolic acidosis in the setting of acute diarrheal illness.   HISTORY OF PRESENT ILLNESS: The patient is a very pleasant 81 year old Caucasian male with past medical history of pancytopenia of undetermined etiology status post a bone marrow biopsy followed by Dr. Ma Hillock, hypertension, diabetes mellitus, iron deficiency anemia, prostate cancer status post resection, colonic polyps and moderate aortic stenosis, who presented to [facility] with an acute diarrheal illness. He reports that his symptoms began on Sunday. He reports that on Sunday he had a hamburger to eat, but believes this was fairly well cooked. He states that on Monday he was having bowel movements every 20 to 30 minutes. This persisted into Tuesday. Upon presentation here, the patient was found to have quite significant acute renal failure. His creatinine was noted as being 4.22. His baseline creatinine from October 12 here at [facility] was 1.39. He also has evidence of metabolic acidosis with a serum bicarbonate of 13. The patient had a urinalysis which showed 3 RBCs per high-power field, 20 WBCs per high-power field. There is also urine protein of 30 mg/dL. The patient reports that he kept taking his lisinopril while he was having diarrhea. He was also found to be significantly hypotensive in the Emergency Department with a blood pressure 80/39 upon presentation. He also had a CT scan of the abdomen and pelvis without contrast. This demonstrated large calcified stone within the urinary bladder; however, there was no obstruction noted. In addition, there was a moderate amount of liquid stool in the colon which likely reflected the underlying diarrheal  process. The patient reports that he is feeling better since he was admitted. He is receiving 0.9 normal saline at 100 mL/hour. Lisinopril as well as metformin have been held. He has been also started on ciprofloxacin and metronidazole.   PAST MEDICAL HISTORY: 1.  Hypertension.  2.  Diabetes mellitus.  3.  Iron deficiency anemia.  4.  Pancytopenia of undetermined etiology status post bone marrow biopsy, which is followed by Dr. Ma Hillock.  5.  Prostate cancer status post resection.  6.  Colonic polyps and history of hemorrhoids.  7.  Moderate aortic stenosis.   PAST SURGICAL HISTORY: 1.  Prostatectomy.  2.  Bilateral cataract removal.  3.  Left knee surgery.  4.  EGD and colonoscopy in September 2013.   ALLERGIES: No known drug allergies.   CURRENT INPATIENT MEDICATIONS: Include:  1.  0.9 normal saline at 100 mL/hour.  2.  Tylenol 650 mg p.o. every 4 hours p.r.n.  3.  Glimepiride 8 mg p.o. daily.  4.  Sliding scale insulin.  5.  Lovastatin 40 mg p.o. daily.  6.  Metronidazole 500 mg p.o. q. 8 hours.  7.  Zofran 4 mg IV every 4 hours p.r.n.  8.  Protonix 40 mg p.o. every 6:00 a.m.  9.  Januvia 100 mg p.o. daily.  10.  Ciprofloxacin 400 mg IV every 24 hours.   SOCIAL HISTORY: The patient resides in [place] with his daughter. He is a widower. The patient reports that he used to smoke from age 57 to 77. He has not smoked since then. No current alcohol or illicit drug use. The patient lives independently in terms of his activities of daily living.  FAMILY HISTORY: The patient states that both his mother and father died secondary to heart disease.   REVIEW OF SYSTEMS:  CONSTITUTIONAL: Currently denies fevers, chills or weight loss.  EYES: Denies diplopia, blurry vision. Has history of cataracts.  HEENT: Denies headaches or hearing loss. Denies epistaxis.  CARDIOVASCULAR: Denies chest pain, palpitations, PND, orthopnea.  RESPIRATORY: Denies cough, shortness of breath or hemoptysis.   GASTROINTESTINAL: Positive for lower abdominal pain as well as diarrhea.   GENITOURINARY: Earlier had some dysuria; however, this has now resolved.  MUSCULOSKELETAL: Denies joint pain, swelling or redness.  INTEGUMENTARY: Denies skin rashes or lesions.  NEUROLOGIC: Denies focal extremity numbness, weakness or tingling.  PSYCHIATRIC: Denies depression, bipolar disorder.  ENDOCRINE: Denies polyuria, polydipsia, or polyphagia. Does have history of diabetes mellitus.  HEMATOLOGIC AND LYMPHATIC: Denies easy bruisability, bleeding, or swollen lymph nodes. He does have a history of pancytopenia followed by Dr. Ma Hillock.  ALLERGY/IMMUNOLOGIC: Denies seasonal allergies or history of immunodeficiency.   PHYSICAL EXAMINATION: VITAL SIGNS: From admission showed a temperature of 98.4, pulse 102, respirations 18 and blood pressure 80/39. Current vital signs show temperature 97.5, pulse 82, respirations 20, blood pressure 122/63 with a pulse oximetry 99% on room air.  GENERAL: Reveals a well-developed, well-nourished Caucasian male who appears his stated age, currently in no acute distress.  HEENT: Normocephalic, atraumatic. Extraocular movements are intact. Pupils equal, round, and reactive to light. No scleral icterus. Conjunctivae are pink. No epistaxis noted. Gross hearing intact. Oral mucosae are slightly dry.  NECK: Supple without JVD or lymphadenopathy.  LUNGS: Clear to auscultation bilaterally with normal respiratory effort.  HEART: S1, S2 regular rate and rhythm. A 2/6 systolic ejection murmur heard.  ABDOMEN: Currently soft, nontender, nondistended. Bowel sounds positive. No rebound or guarding. No gross organomegaly appreciated.  EXTREMITIES: No clubbing, cyanosis, or edema.   NEUROLOGIC: The patient is alert and oriented to time, person and place. Strength is 5/5 in both upper and lower extremities.  SKIN: Warm and dry. No rashes noted.  MUSCULOSKELETAL: No joint redness, swelling or tenderness  appreciated.  GENITOURINARY: No suprapubic tenderness is noted at this time.  PSYCHIATRIC: The patient has an appropriate affect and appears to have good insight into his current illness.   LABORATORY, DIAGNOSTIC AND RADIOLOGIC DATA: Urinalysis shows pH of 5.0, urine protein 30 mg/dL, nitrites negative, 3 RBCs per high-power field, 20 WBCs per high-power field. C. difficile toxin is negative.  CT scan of the abdomen and pelvis demonstrated no evidence of renal obstruction; however, there is large calcified stone within the urinary bladder. There was no evidence of bowel obstruction or ileus; however, there was a moderate amount of liquid stool in the colon.   CBC shows WBC 10.6, hemoglobin 11.8, hematocrit 35.6, platelets of 154, lipase 185. Complete metabolic panel shows sodium 138, potassium 4.4, chloride 113, CO2 13, BUN 52, creatinine 4.22, glucose 128, total protein 6.2, albumin 3.0, osmolality 291. Hemoglobin A1c 6.9. TSH 3.27.   IMPRESSION: This is an 81 year old Caucasian male with past medical history of pancytopenia of undetermined etiology status post bone marrow biopsy followed by Dr. Ma Hillock, hypertension, diabetes mellitus, iron deficiency anemia, prostate cancer status post resection, colonic polyps and hemorrhoids, moderate aortic stenosis, who presented to [facility] with acute diarrheal illness and found to have acute renal failure, metabolic acidosis and hypotension.   PROBLEM LIST: 1.  Acute renal failure. Most likely acute tubular necrosis from hypotension.  2.  Metabolic acidosis due to diarrhea and acute renal failure.  3.  Hypotension likely due to the significant volume loss from diarrhea.  4.  Acute diarrhea, likely infectious.   PLAN: The patient appears to have had very severe diarrheal illness. It has led to acute renal failure that has been complicated by hypotension and metabolic acidosis. The patient has been started on ciprofloxacin and Flagyl.  Initial Clostridium difficile toxin was negative. There was liquid stool noted in the colon on CT scan of the abdomen and pelvis. There was no obstruction noted on the CT scan of the abdomen and pelvis. We recommend continued hydration; however, we will discontinue normal saline in favor of a sodium bicarbonate drip. We will continue to monitor renal function closely. There is a chance that if his renal function were to worsen, we may need to consider temporary dialysis; however, we are hopeful that his kidney function will improve. The patient's hypotension should also be treated with volume repletion, which can be achieved with sodium bicarbonate drip. We would certainly recommend holding lisinopril at this point in time as well as metformin. Avoid all other nephrotoxins including NSAIDs and IV contrast at this point in time. We will continue to monitor the patient's progress quite closely.   I would like to thank Dr. Tressia Miners for this kind referral.   ____________________________ Tama High, MD mnl:cc D: 02/01/2013 17:54:23 ET T: 02/01/2013 19:17:58 ET JOB#: [id number]  cc: Tama High, MD, <Dictator> Mariah Milling Jaquay Posthumus MD ELECTRONICALLY SIGNED 02/07/2013 10:53

## 2014-12-13 NOTE — H&P (Signed)
PATIENT NAME:  Colin Harper, Colin Harper MR#:  [id number] DATE OF BIRTH:  05-22-1930  DATE OF ADMISSION:  02/01/2013  ADMITTING PHYSICIAN:  Gladstone Lighter, MD  PRIMARY CARE PHYSICIAN: Ramonita Lab, MD  PRIMARY ONCOLOGIST: Leia Alf, MD  CHIEF COMPLAINT: Diarrhea.   HISTORY OF PRESENT ILLNESS: Colin Harper is a very pleasant 81 year old Caucasian male who lives at home, very active functionally at baseline, with past medical history significant for hypertension, diabetes, pancytopenia likely secondary to myelodysplasia and moderate aortic stenosis who comes from home secondary to sudden onset of diarrhea going on for 3 days now. The patient denies any recent travel, denies eating outside or any kind of food poisoning. Three days ago, morning, he woke up and started to have diarrhea associated with lower abdominal cramps. Denies any fevers, chills, nausea or vomiting, except he noticed some nausea this morning. Diarrhea went on for a couple of days. It was loose, watery stool.  No mucus or blood in the stools. He felt better yesterday, but again it started this morning. He was feeling lightheaded, dizzy, and came to the Emergency Room. He was hypotensive initially with systolic blood pressure of 80 and diastolic pressure of 39 when he came in and also labs reveal acute renal failure so he is being admitted for the same.   PAST MEDICAL HISTORY: 1.  Pancytopenia of unknown etiology, status post bone marrow biopsy, which was negative for any myelodysplasia. 2.  Hypertension.  3.  Non-insulin-dependent diabetes mellitus.  4.  Iron deficiency anemia.  5.  Prostate cancer status post resection.  6.  Colon polyps and hemorrhoids. Last colonoscopy was from December 2008. 7.  Moderate aortic stenosis.  PAST SURGICAL HISTORY: 1.  Prostatectomy.  2.  Bilateral cataract surgery.  3.  Left knee surgery.  4.  Last EGD and colonoscopy in September 2013 which showed normal EGD and cecal angiodysplasia seen on  colonoscopy.   ALLERGIES: No known drug allergies.   CURRENT HOME MEDICATIONS: 1.  Januvia 100 mg p.o. daily.  2.  Metformin 1000 mg p.o. b.i.d.  3.  Lovastatin 40 mg p.o. daily.  4.  Lisinopril 40 mg p.o. daily.  5.  Imdur 30 mg p.o. daily.  6.  Glimepiride 8 mg p.o. daily.   SOCIAL HISTORY: Lives at home with his daughter. Quit smoking more than 50 years ago. No alcohol use. Very functional and active at baseline.   FAMILY HISTORY: Father and brothers died from heart disease and mom had diabetes and also stroke.  REVIEW OF SYSTEMS:   CONSTITUTIONAL: No fever, fatigue or weakness.  EYES: No blurred vision, double vision, inflammation or glaucoma. History of cataracts.  ENT: No tinnitus, ear pain, hearing loss, epistaxis or discharge.  RESPIRATORY: No cough, wheeze, hemoptysis or COPD. CARDIOVASCULAR: No chest pain, orthopnea, edema, arrhythmia, palpitations or syncope. No dyspnea on exertion.  GASTROINTESTINAL: Positive for nausea. No vomiting. Positive for diarrhea and also abdominal pain. No hematemesis or melena.  GENITOURINARY: No dysuria, hematuria, renal calculus or incontinence.  ENDOCRINE: No polyuria, nocturia, thyroid problems, heat or cold intolerance.  HEMATOLOGY: No anemia, easy bruising or bleeding.  SKIN: No acne, rash or lesions.  MUSCULOSKELETAL: No neck, back, shoulder pain, arthritis or gout.  NEUROLOGIC: No numbness, weakness, CVA, TIA or seizures. PSYCH:  No anxiety, insomnia or depression.   PHYSICAL EXAMINATION: VITAL SIGNS: Temperature 98.4 degrees Fahrenheit, pulse 102, respirations 18, blood pressure 80/39 and pulse ox 98% on room air.  GENERAL: Well-built, well-nourished male sitting in bed, not in any  acute distress.  HEENT: Normocephalic, atraumatic. Pupils equal, round and reacting to light. Anicteric sclerae. Extraocular movements intact. Nasopharynx is clear without any lesions or drainage. Ears are showing no external lesions or drainage. Oropharynx  is clear without erythema, mass or exudates. Dry mucous membranes are present.  NECK: Supple. No thyromegaly, JVD or carotid bruits. No lymphadenopathy. Range of motion is normal without pain.  LUNGS: Clear to auscultation bilaterally. No wheeze or crackles. No use of accessory muscles for breathing.  HEART:  S1 and S2 regular rate and rhythm, 3/6 systolic murmur in the aortic area. Pulses equal and normal at the carotids and also femorals.  ABDOMEN: Soft, nontender and nondistended. No hepatosplenomegaly. Normal bowel sounds on my exam. No guarding, rigidity or rebound tenderness.  MUSCULOSKELETAL: Normal gait and no tenderness or effusion of the joints.  SKIN: No acne, rash or lesions. Sell-hydrated skin.  LYMPHATICS: No cervical or inguinal lymphadenopathy.  EXTREMITIES: No pedal edema. No clubbing or cyanosis. 2+ dorsalis pedis pulses palpable bilaterally.  NEUROLOGIC: Cranial nerves II through XII are intact. Strength is 5/5 in all 4 extremities. Sensation intact and normal cerebellar function tests. PSYCHOLOGICAL: The patient is awake, alert and oriented x 3. Good mood and fair judgment.   LABORATORY AND DIAGNOSTICS: Urinalysis: 2+ leukocyte esterase with few WBCs and trace bacteria.   WBC 10.6, hemoglobin 11.8, hematocrit 35.6, platelet count 154.   Sodium 138, potassium 4.4, chloride 113, bicarb 30, BUN 52, creatinine 4.22, glucose 128 and calcium of 8.8.   ALT 24, AST 26, alk phos 68, total bilirubin 0.3 and albumin of 3.0.  CT of the abdomen and pelvis showing no evidence of bowel obstruction or ileus, moderate amount of liquid stool is present in the colon reflecting a diarrheal process. No evidence of acute appendicitis, acute diverticulitis or enteritis seen. Gallbladder is contracted and has a couple of large stones.  No acute abnormalities in the liver or pancreas noted. Again kidneys with no evidence of obstruction, although there is a large calcified stone within the urinary  bladder. Erythematous changes in the lung bases and prior evidence of granulomatous infection is present in the right lower lobe.   ASSESSMENT AND PLAN: This is an 81 year old pleasant male with past medical history significant for hypertension, diabetes and aortic stenosis admitted with 3 day history of diarrhea and also noted to be in acute renal failure.  1.  Acute diarrhea for 3 days now. Also associated with lower abdominal cramping. CT of the abdomen did not show any acute colitis or enteritis. Stool studies have been sent and cultures are also sent. Will start empirically on Cipro and Flagyl until then. Continue IV fluids at this time. Could have been viral process. Antibiotics can be stopped once symptoms improve and cultures are negative.  2.  Hypotension secondary to hypovolemia. Continue IV fluids and hold blood pressure medications.  3.  Acute renal failure, likely acute tubular necrosis from hypertension. He was also taking metformin and lisinopril at home, which we will hold at this time and hold any other nephrotoxins. Continue IV fluids. CT of the abdomen did show normal kidneys. There was a large calcified stone in the bladder. At this time it is nonobstructing so continue to monitor and follow up renal function in the a.m. and nephrology consult.  4.  Diabetes mellitus. Check hemoglobin A1c.  Continue sliding scale insulin and hold the metformin. Continue Januvia and glimepiride.  5.  Gastrointestinal and deep venous thrombosis prophylaxis. On Protonix and TED stockings.  CODE STATUS: FULL CODE.   TIME SPENT ON ADMISSION: 50 minutes. ____________________________ Gladstone Lighter, MD rk:sb D: 02/01/2013 12:51:32 ET T: 02/01/2013 13:38:59 ET JOB#: [id number]  cc: Gladstone Lighter, MD, <Dictator> Adin Hector, MD Sandeep R. Ma Hillock, MD Gladstone Lighter MD ELECTRONICALLY SIGNED 02/12/2013 13:52

## 2014-12-13 NOTE — Discharge Summary (Signed)
PATIENT NAME:  Colin Harper, Colin Harper MR#:  [id number] DATE OF BIRTH:  1929/11/18  DATE OF ADMISSION:  02/01/2013 DATE OF DISCHARGE:    FINAL DIAGNOSES:  1.  Diarrhea, presumed infectious, etiology unclear.  2.  Acute renal failure secondary to acute tubular necrosis from hypotension and dehydration related to #1.  3.  Pancytopenia.  4.  Hypertension.  5.  Adult onset diabetes mellitus.  6.  History of prostate cancer with prior resection.  7.  Moderate aortic valvular stenosis.  8.  Metabolic acidosis.   HISTORY AND PHYSICAL: Please see dictated admission history and physical.    HOSPITAL COURSE: The patient was admitted with evidence of acute renal failure after having had diarrhea for 3 days. Stool cultures were negative. C. difficile was negative. He was placed on bicarbonate drip initially secondary to metabolic acidosis and under the direction of nephrology. His renal function slowly improved to 1.62 on the day prior to discharge, which is approaching his baseline creatinine of approximately 1.3. Metformin, lisinopril and Januvia were all held. Blood pressure and blood sugar slowly rose, but were within reasonable limits. His diet was advanced and he was able to tolerate this without issue. His stool frequency decreased and his stools became more formed. His pancytopenia remains essentially stable after the first 24 hours of his hospitalization and are similar to his home numbers and he is followed by oncology for this already. CT of the abdomen and pelvis was performed, which showed loose stool within colon without actual colitis. He was placed on Cipro and Flagyl empirically and will complete a 7-day course of this at home. He was noted to have gallstones, though there was no clear evidence of this is causing him symptoms. He was also noted to have one stone within the bladder, again not clear if this was playing any role. The patient was ambulating, eating, and felt close to baseline and felt ready  for discharge, and therefore will be discharged home in stable condition with his physical activity to be up as tolerated. His diet should be carbohydrate controlled and he is advised to avoid dairy except yogurt for 1 week. Physical activity up as tolerated. He will follow up in our office on 06/30 and already has an appointment for this.   DISCHARGE MEDICATIONS:  1.  Lovastatin 40 mg p.o. at bedtime.  2.  Glimepiride 4 mg 2 tablets p.o. daily.  3.  Imdur 30 mg p.o. daily.  4.  Metronidazole 500 mg p.o. q. 8 hours x 4 days.  5.  Cipro 500 mg p.o. b.i.d. x 4 days.  6.  He will hold metformin, lisinopril and Januvia at this point.   ____________________________ Adin Hector, MD bjk:aw D: 02/05/2013 07:52:04 ET T: 02/05/2013 09:00:26 ET JOB#: [id number]  cc: Adin Hector, MD, <Dictator> Sandeep R. Ma Hillock, MD Ramonita Lab MD ELECTRONICALLY SIGNED 02/14/2013 7:50

## 2014-12-14 NOTE — Consult Note (Signed)
Pt known to me from previous admissions. Colonoscopy in 2014 was normal though colonoscopy in 2013 did show few cecal AVM's and diminutive polyp that was removed. 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 Agree with stopping ASA/plavix. Moniter hgb and if continues to fall, please transfuse. If active bleeding, order bleeding scan to see if bleeding from LGI tract vs UGI tract. Will check on patient in AM. thanks.  Electronic Signatures: Verdie Shire (MD)  (Signed on 15-Apr-15 16:33)  Authored  Last Updated: 15-Apr-15 16:33 by Verdie Shire (MD)

## 2014-12-14 NOTE — H&P (Signed)
PATIENT NAME:  Colin Harper, BROSTROM MR#:  [id number] DATE OF BIRTH:  Feb 12, 1930  DATE OF ADMISSION:  12/05/2013  PRIMARY CARE PROVIDER: Dr. Ramonita Lab.   EMERGENCY DEPARTMENT REFERRING PHYSICIAN:  Dr. Jasmine December.   CHIEF COMPLAINT: Bright red blood per rectum.   HISTORY OF PRESENT ILLNESS: Patient is a pleasant 81 year old white male with history of pancytopenia, hypertension, history of non-insulin dependent diabetes, iron deficiency anemia, prostate cancer, history of AVMs, hemorrhoids, colonic polyps, aortic valve stenosis, who underwent a recent aortic valve replacement in February. At that time, he also had a pacemaker placed. Who was started on aspirin and Plavix since then. Patient was doing well up until this morning when he started having bright red blood per rectum. Initially started with a large episode. Woke up with this when he went to the bathroom. He again 2 hours later had another episode. Since then, he has not had any further episodes. He denies any chest pain or shortness of breath or palpitations. Denies any nausea, vomiting, or diarrhea. Denies any abdominal pain. Denies any fevers, chills.   PAST MEDICAL HISTORY:  1.  Significant for pancytopenia of unknown etiology, status post bone marrow biopsy, which was negative for any myelodysplasia. Has been followed by Dr. Ma Hillock. Receives transfusions intermittently. Last transfusion, a few weeks ago according to the daughter,  2.  Hypertension.  3.  Non-insulin dependent diabetes.  4.  Iron deficiency anemia.  5.  Prostate cancer, status post resection.  6.  History of colonic polyp and hemorrhoids.  7.  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 That required cauterization in the past.  8.  Aortic stenosis, status post aortic valve placement with a mechanical valve.  9.  Status post pacemaker placement.  10. Status post prostatectomy.  11. Status post bilateral cataract surgery.  12. Status post left knee surgery.    ALLERGIES: None.    CURRENT MEDICATIONS: Aspirin 81 mg 1 tablet p.o. daily, carvedilol 3.125 one tablet p.o. b.i.d., Plavix 75 p.o. daily, iron sulfate 324 daily, glimepiride 4 mg 1 tablet p.o. b.i.d., isosorbide mononitrate 60 one half tablet daily, lisinopril 10 daily, lovastatin 40 daily.   SOCIAL HISTORY: Does not smoke. Does not drink. Used to smoke 50 years ago.   FAMILY HISTORY: Father and brother died from heart disease and mom had diabetes and also stroke.   REVIEW OF SYSTEMS.  CONSTITUTIONAL: Denies any fevers. Complains of some weakness. No weight gain, weight loss.  EYES: No blurred or double vision, history of cataracts. No redness. No inflammation.  EARS, NOSE AND THROAT: No tinnitus. No ear pain. There is some hearing loss. No seasonal or year-round allergies. No epistaxis. No nasal discharge. No difficulty swallowing.  RESPIRATORY: Denies any cough, wheezing, hemoptysis.  CARDIOVASCULAR: Denies any chest pain, orthopnea, or edema.  GASTROINTESTINAL: No nausea, vomiting, diarrhea. No abdominal pain. No hematemesis. No melena. Complains of bright red blood per rectum. History of AVMs in the past.  GENITOURINARY: Denies any dysuria, hematuria, renal calculi, or frequency.  ENDOCRINE: Denies any polydipsia or nocturia.  HEMATOLOGIC AND LYMPHATIC:  Has chronic pancytopenia, has been followed by hematology.  SKIN: No acne. No rash. No changes in mole, hair, or skin.  MUSCULOSKELETAL: Denies any pain in the neck, back or shoulder.  NEUROLOGIC: No numbness, CVA, TIA.   PSYCHIATRIC: No anxiety, insomnia, or ADD.   PHYSICAL EXAMINATION:  VITAL SIGNS: Temperature 97.7, pulse 61, respirations 18, blood pressure 159/114.  GENERAL: The patient is a well-developed, well-nourished male in no acute  distress.  HEENT: Head atraumatic, normocephalic. Pupils equally round, reactive to light in accommodation. There is conjunctival pallor. No scleral icterus. Nasal exam shows no drainage or ulceration. Oropharynx  is clear without any exudate.  NECK: Supple without any JVD.  CARDIOVASCULAR: Regular rate and rhythm. No murmurs, rubs, clicks, or gallops.  LUNGS: Clear to auscultation bilaterally without any rales, rhonchi, wheezing.  ABDOMEN: Soft, nontender, nondistended. Positive bowel sounds x 4.  EXTREMITIES: No clubbing, cyanosis, or edema.  SKIN: No rash.  LYMPHATICS: No lymph nodes palpable.  VASCULAR: Good DP, PT pulses.  PSYCHIATRIC: Not anxious or depressed.  NEUROLOGIC: Awake, alert, oriented x 3. No focal deficits.   LABORATORY DATA: Glucose 217, BUN 34, creatinine 1.57, sodium 142, potassium 4.3, chloride 111, CO2 is 23. LFTs are normal except total protein of 5.5, albumin 3.2. WBC 4.2, hemoglobin 7.5, platelet count is 92,000. INR 1.1.    ASSESSMENT AND PLAN: The patient is a pleasant 81 year old white male who presents with bright red blood per rectum. Previous history of GI bleed in the past.  1.  Bright red blood per rectum. Differential broad. Could be just internal hemorrhoids. Could be colonic AVMs. Unlikely upper GI bleed, however, with his history of gastric AVMs I am going to place him on Protonix b.i.d. We will hold his aspirin and Plavix for now, have GI see the patient. Follow his hemoglobin. Transfuse as needed.  2.  Acute blood loss anemia on chronic anemia. Even though his hemoglobin is close to baseline it will likely drop further with this large bowel movement. At this time, we will go ahead and transfuse the patient. He is consented and risks and benefits explained. Patient agreeable to transfusion.  3.  Diabetes type 2. We will place him on sliding scale. Continue oral medicine as taking at home.  4.  Hypertension. Continue lisinopril and Imdur. Patient's blood pressure is accelerated here. We will use p.r.n. hydralazine for elevated blood pressure.  5.  Chronic renal failure. We will give him some IV hydration, follow his renal function.  6.  Recent aortic valve replacement.  Hold aspirin and Plavix.  7.  Miscellaneous he will be on lower extremity stockings for DVT.   TIME SPENT: 50 minutes.    ____________________________ Lafonda Mosses Posey Pronto, MD shp:tc D: 12/05/2013 10:12:38 ET T: 12/05/2013 11:46:54 ET JOB#: [id number]  cc: Amarrion Pastorino H. Posey Pronto, MD, <Dictator> Alric Seton MD ELECTRONICALLY SIGNED 12/07/2013 13:48

## 2014-12-14 NOTE — Discharge Summary (Signed)
PATIENT NAME:  Colin Harper, Colin Harper MR#:  [id number] DATE OF BIRTH:  12/18/1929  DATE OF ADMISSION:  12/05/2013  DATE OF DISCHARGE:  12/09/2013  PRIMARY CARE PROVIDER: Ramonita Lab, MD  CONSULTANTS: 1.  Verdie Shire, MD, Gastroenterology.  2.  Lujean Amel, MD, Cardiology.   DISCHARGE DIAGNOSES: 1.  Upper gastrointestinal bleed.  2.  Duodenal arteriovenous malformation.  3.  Hypertension.  4.  Iron deficiency anemia.  5.  History of aortic valve replacement and pacemaker placement.   DISCHARGE MEDICATIONS: 1.  Aspirin 81 mg daily. 2.  Plavix 75 mg daily. 3.  Lisinopril 20 mg daily.  4.  Carvedilol 3.125 mg b.i.d.  5.  Lovastatin 40 mg at bedtime.  6.  Glimepiride 4 mg daily.  7.  Iron 325 once daily.  8.  Isosorbide mononitrate extended-release 60 mg 1/2 tab daily.   HISTORY OF PRESENT ILLNESS: This is an 81 year old male who presented with one day of bright red blood per rectum. He was otherwise asymptomatic, and specifically denied abdominal pain, nausea, vomiting, chest pain, shortness of breath, fevers and chills. Outpatient medications did include aspirin and Plavix. These had been initiated after aortic valve replacement in February 2015. Initial labs were notable for a hemoglobin of 7.5, INR of 1.1, and low platelet of 92,000. Please see dictated History and Physical for further details.   HOSPITAL COURSE: Initially his aspirin and Plavix were held. He was started on Protonix twice daily. Gastroenterology was consulted. He was consented, and then subsequently transfused with 2 units packed red blood cells due to his anemia. Hemoglobin subsequently increased to 9.1. Dr. Candace Cruise performed an upper endoscopy as well as colonoscopy. These studies were notable for 2 small AVMs in the duodenum, which were cauterized. Additionally, Cardiology was consulted regarding need for anticoagulation. The recommendation was given to restart aspirin and Plavix after his GI bleed had resolved, and underlying cause  identified and treated. The patient remained stable throughout his course. He did have elevated blood pressure, despite continuing his outpatient blood pressure agents. Therefore, lisinopril was added to his regimen and titrated up to 20 mg daily. P   DISCHARGE INSTRUCTIONS: 1.  The patient is to follow up with his cardiologist, Dr. Saralyn Pilar, the day after discharge on April 20 as scheduled.   2.  The patient is recommended to see Dr. Ramonita Lab in followup in 2 to 3 weeks.   3.  The patient was advised about the signs and symptoms of GI bleed, and cautioned to alert his physicians if he had any further concerns.    ____________________________ A. Lavone Orn, MD ams:mr D: 12/09/2013 09:51:45 ET T: 12/09/2013 18:37:32 ET JOB#: [id number]  cc: A. Lavone Orn, MD, <Dictator> Adin Hector, MD Lupita Dawn. Candace Cruise, MD Isaias Cowman, MD    Sherlon Handing MD ELECTRONICALLY SIGNED 12/11/2013 17:35

## 2014-12-14 NOTE — Consult Note (Signed)
Chief Complaint:  Subjective/Chief Complaint Pt doing ok no further bleeding . Awaiting scope today.   VITAL SIGNS/ANCILLARY NOTES: **Vital Signs.:   17-Apr-15 07:29  Vital Signs Type Recheck  Pulse Pulse 63  Systolic BP Systolic BP 470  Diastolic BP (mmHg) Diastolic BP (mmHg) 57  Mean BP 96  Telemetry pattern Cardiac Rhythm Paced rhythm; pattern reported by Telemetry Clerk; 62  *Intake and Output.:   Daily 17-Apr-15 07:00  Grand Totals Intake:  4545 Output:  600    Net:  3945 24 Hr.:  9628  Oral Intake      In:  2880  IV (Primary)      In:  1665  Urine ml     Out:  600  Length of Stay Totals Intake:  7016 Output:  600    Net:  6416   Brief Assessment:  GEN well developed, well nourished, no acute distress   Cardiac Regular  murmur present   Respiratory normal resp effort  clear BS   Gastrointestinal Normal   Gastrointestinal details normal Soft  Nontender  Nondistended   EXTR negative cyanosis/clubbing, negative edema   Lab Results: Routine Chem:  16-Apr-15 05:26   Glucose, Serum  125  BUN  21  Creatinine (comp) 1.22  Sodium, Serum  146  Potassium, Serum 3.8  Chloride, Serum  118  CO2, Serum 21  Calcium (Total), Serum  8.1  Anion Gap 7  Osmolality (calc) 295  eGFR (African American) >60  eGFR (Non-African American)  55 (eGFR values <48m/min/1.73 m2 may be an indication of chronic kidney disease (CKD). Calculated eGFR is useful in patients with stable renal function. The eGFR calculation will not be reliable in acutely ill patients when serum creatinine is changing rapidly. It is not useful in  patients on dialysis. The eGFR calculation may not be applicable to patients at the low and high extremes of body sizes, pregnant women, and vegetarians.)  Routine Hem:  16-Apr-15 00:10   Hemoglobin (CBC)  7.4 (Result(s) reported on 06 Dec 2013 at 12:45AM.)    05:26   WBC (CBC)  3.1  RBC (CBC)  2.52  Hemoglobin (CBC)  7.6  Hematocrit (CBC)  23.7  Platelet  Count (CBC)  81  MCV 94  MCH 30.4  MCHC 32.2  RDW  16.3  Neutrophil % 60.0  Lymphocyte % 19.3  Monocyte % 12.3  Eosinophil % 7.0  Basophil % 1.4  Neutrophil # 1.9  Lymphocyte #  0.6  Monocyte # 0.4  Eosinophil # 0.2  Basophil # 0.0 (Result(s) reported on 06 Dec 2013 at 06:15AM.)    10:15   Hemoglobin (CBC)  7.9 (Result(s) reported on 06 Dec 2013 at 10:30AM.)   Radiology Results: Cardiology:    15-Apr-15 09:04, ED ECG  Ventricular Rate 59  Atrial Rate 59  P-R Interval 134  QRS Duration 138  QT 510  QTc 504  P Axis 51  R Axis 21  T Axis 81  ECG interpretation   AV sequential or dual chamber electronic pacemaker  When compared with ECG of 08-Jun-2011 05:54,  Electronic ventricular pacemaker has replaced Sinus rhythm  ----------unconfirmed----------  Confirmed by OVERREAD, NOT (100), editor PEARSON, BARBARA (325 on 12/06/2013 1:06:58 PM  ED ECG    Assessment/Plan:  Invasive Device Daily Assessment of Necessity:  Does the patient currently have any of the following indwelling devices? none   Assessment/Plan:  Assessment IMP LGI bleeding Anemia Aortic replacement HTN COPD DJD GERD SSS Hx PPM .  Plan PLAN Agree with Scope today Continue to Hold ASA/Plavix Agree with transfusion as necessary Protonix for GERD Case discussed with Dr Jenne Pane at [facility]    he recommendshold asa/plavix until GI w/u complete No indication for cardiac cath I wll continue to follow until d/c Cardiology f/u with [facility] 1-2 weeks after d/c   Electronic Signatures: Lujean Amel D (MD)  (Signed 18-Apr-15 05:54)  Authored: Chief Complaint, VITAL SIGNS/ANCILLARY NOTES, Brief Assessment, Lab Results, Radiology Results, Assessment/Plan   Last Updated: 18-Apr-15 05:54 by Yolonda Kida (MD)

## 2014-12-14 NOTE — Consult Note (Signed)
No bleeding anywhere in the GI tract. 2 small duodenal AVM's seen. These were cauterized. No colon AVM's seen. Mech soft diet ordered. Can resume plavix/ASA IF and When necessary. If patient bleeds again, will have to repeat video capsule study as outpt. Will sign off. thanks.  Electronic Signatures: Verdie Shire (MD)  (Signed on 17-Apr-15 10:48)  Authored  Last Updated: 17-Apr-15 10:48 by Verdie Shire (MD)

## 2014-12-14 NOTE — Consult Note (Signed)
Pt tolerated the bowel prep. No bleeding from bowel prep. Will plan EGD/colon this AM. thanks  Electronic Signatures: Verdie Shire (MD)  (Signed on 17-Apr-15 07:57)  Authored  Last Updated: 17-Apr-15 07:57 by Verdie Shire (MD)

## 2014-12-14 NOTE — Consult Note (Signed)
PATIENT NAME:  Colin Harper, Colin Harper MR#:  [id number] DATE OF BIRTH:  05/04/1930  DATE OF CONSULTATION:  12/06/2013  REFERRING PHYSICIAN:   CONSULTING PHYSICIAN:  Dwayne D. Clayborn Bigness, MD  PRIMARY CARE PHYSICIAN: Ramonita Lab, MD   INDICATION: Aortic valve disease and bright red blood per rectum.   HISTORY OF PRESENT ILLNESS: The patient is an 81 year old white male with history of pancytopenia, hypertension, non-insulin-dependent diabetes, iron deficient anemia, prostate cancer, history of AVMs, hemorrhoids, colonic polyps, and aortic valve stenosis. Underwent recent aortic valve replacement with Cor-Valve at [facility] in February 2015 by Dr. Jenne Pane. He also had a pacemaker placed for unclear reasons, probably sick sinus syndrome. He was placed on aspirin and Plavix and had done reasonably well, but then started having episodes of bright red blood per rectum and bleeding and had a large episode in the bathroom so eventually came to the Emergency Room and found to have severe anemia so was admitted for further evaluation and care. Denies nausea or vomiting.   PAST MEDICAL HISTORY: Pancytopenia, hypertension, non-insulin dependent diabetes, iron deficiency anemia, prostate cancer, colonic polyps, hemorrhoids, AVMs, aortic stenosis with sick sinus syndrome.   PAST SURGICAL HISTORY: Aortic valve replacement by Cor-Valve, prostate cancer status post resection, prostatectomy, bilateral cataract surgery, knee surgery.   ALLERGIES: None.   SOCIAL HISTORY: Widowed. No smoking. No alcohol consumption. Used to smoke 50 years ago.   FAMILY HISTORY: Heart disease, diabetes, stroke.   MEDICATIONS: Aspirin 81 mg a day, Plavix 75 mg daily, Coreg 3.125 twice a day, iron sulfate 324 daily, Glimepiride 4 mg twice a day, Imdur 60 mg 1/2 tablet daily, lisinopril 10 a day, lovastatin 40 a day.   REVIEW OF SYSTEMS: Lower GI bleed and bright red blood per rectum, anemia, weakness, and fatigue. Otherwise, no nausea or  vomiting. No fever. No chills. No sweats. No weight loss. No weight gain. No hemoptysis. No hematemesis. No chest pain. No shortness of breath. Otherwise negative.   PHYSICAL EXAMINATION: VITAL SIGNS: Blood pressure 150/100, pulse 60, respiratory rate 14. HEENT: Normocephalic, atraumatic. Pupils equal and reactive to light.  NECK: Supple. No significant JVD, bruits or adenopathy.  LUNGS: Clear to auscultation and percussion. No significant wheeze, rhonchi, or rale.  HEART: Systolic ejection murmur left sternal border, 2/6. Positive S4. PMI nondisplaced. ABDOMEN: Benign. Positive bowel sounds. No rebound, guarding or tenderness.  EXTREMITY: Within normal limits.  NEUROLOGIC: Intact.  SKIN: Normal.  LABORATORY DATA: Glucose 217, BUN 34, creatinine 1.57, sodium 142, potassium 4.3, chloride 111, CO2 23. LFTs negative. Albumin 3.2. White count of 4.2, hemoglobin 7.5, platelet count 92,000.   ASSESSMENT:  1.  Bright red blood per rectum. 2.  Aortic valve disease. 3.  Anemia. 4.  Diabetes. 5.  Hypertension. 6.  Chronic renal insufficiency.  PLAN:  1.  Agree with transfusion. Agree with holding aspirin and Plavix for now. Agree with scope, upper and lower. Depending on the results of the scope, we will consider whether restart aspirin. We will hold Plavix for now. If he continues to bleed, we will hold aspirin and Plavix indefinitely. 2.  Diabetes. Continue current medications. Follow sliding scale while in the hospital.  3.  Hypertension. Continue lisinopril, Imdur, and hydralazine p.r.n. if necessary. 4.  For renal insufficiency, continue current medications. Continue to hydrate. 5.  For acute blood loss, continue anemia therapy and work-up as per gastroenterology.  6.  Aortic valve. Again, we will hold aspirin and Plavix for now. It would have been great to have gotten  3 months of aspirin and Plavix dual therapy, but we will continue to see hoping the bleeding stops and then will consider  restarting aspirin. Continue current therapy. We will defer further recommendations until after the scope.   ____________________________ Loran Senters. Clayborn Bigness, MD ddc:sb D: 12/06/2013 14:05:25 ET T: 12/06/2013 14:27:45 ET JOB#: [id number]  cc: Dwayne D. Clayborn Bigness, MD, <Dictator> Yolonda Kida MD ELECTRONICALLY SIGNED 01/09/2014 13:50

## 2014-12-14 NOTE — Consult Note (Signed)
Pt seen and examined. Full consult to follow. See last night's notes. No abd pain. Bleeding has slowed down. Given 1 unit of blood last night. Had AVR/pacemaker placed in late Jan. So. while bleeding will likely stop once patient is off ASA/plavix, he is likely to rebleed if he has to go back on place due to his new aortic valve. Therefore, patient wants to find out for sure where bleeding is coming from. BRBPR suggests LGI bleed but with known hx of duodenal AVM's, we will plan on both EGD/colonoscopy tomorrow.  Electronic Signatures: Verdie Shire (MD)  (Signed on 16-Apr-15 07:57)  Authored  Last Updated: 16-Apr-15 07:57 by Verdie Shire (MD)

## 2014-12-14 NOTE — H&P (Signed)
PATIENT NAME:  Colin Harper, Colin Harper MR#:  [id number] DATE OF BIRTH:  [birth year]  DATE OF ADMISSION:  12/05/2013  CONSULTING PHYSICIAN: Hulen Luster, M.D.   REASON FOR REFERRAL: Gross rectal bleeding.   DESCRIPTION: The patient is a pleasant 81 year old white male who had an aortic valve replacement followed by pacemaker place in the end of January. He was placed on aspirin and Plavix. He was doing well until he woke up yesterday morning with a large amount of gross red blood per rectum. Then, he had another bout which is smaller. He then had his daughter call the EMS to bring him to the hospital. He did not feel any lightheadedness or dizziness. He denied having any abdominal pain or cramping. There is no recent hematochezia, until just now. There is no diarrhea or constipation. No fevers or chills or heartburn or indigestion. When he was admitted he was anemic with hemoglobin of 7.5. He was given 1 unit of blood last night. Hemoglobin went up but it fell down to 7.4 this morning.   The patient has had GI bleeding in the past for which he had to go off the aspirin and Plavix. He does have a history of arteriovenous malformations. When he had a colonoscopy in 2013, he had few small cecal AVMs and diminutive polyp that was removed. At that time endoscopy appeared normal. However, video capsule showed some distal duodenal arteriovenous malformations. Colonoscopy and upper endoscopy was repeated last year. This time the colon looked normal. I did not see any obvious AVMs. Nevertheless, duodenal AVMs were present. These were cauterized. He also has a history of hemorrhoids. He does not feel that hemorrhoids were present this time. After he was admitted the patient has had a small amount of blood per rectum but the bleeding has slowed down significantly. The aspirin and Plavix were held.   PAST MEDICAL HISTORY: Notable for history of pancytopenia requiring bone marrow biopsy. The biopsies were negative. He also has a  history of diabetes, hypertension, iron deficiency anemia.  Other history includes prostate cancer history requiring resection. He had aortic stenosis and required aortic valve replacement with mechanical valve. Other history includes a pacemaker placement, prostatectomy, cataract surgery and left knee surgery.   ALLERGIES: He has no known drug allergies.   MEDICATIONS AT HOME: Include baby aspirin, Carvediol twice a day, Plavix 75 mg daily, iron, glimepiride 4 mg twice a day, isosorbide mononitrate 60 mg 1/2 pill a day, lisinopril 10 mg daily, lovastatin 40 mg daily.  SOCIAL HISTORY: Denies any tobacco or alcohol use.   FAMILY HISTORY: Notable for heart disease and diabetes and stroke.   REVIEW OF SYSTEMS: There is no change from the initial H and P. Please refer to this for details.   PHYSICAL EXAMINATION: GENERAL: The patient is in no acute distress right now.  VITAL SIGNS: He is afebrile. His vital signs this stable.  HEAD: Normocephalic, atraumatic.   HEENT: Pupils are equally reactive. Throat was clear.  NECK: Supple.  CARDIAC: Regular rhythm and rate without any murmurs.  LUNGS: Clear bilaterally.  ABDOMEN: Soft and nontender with normoactive bowel sounds. There is no hepatomegaly.  EXTREMITIES: No clubbing, cyanosis, or edema.  SKIN: Negative.  NEUROLOGICAL: Examination is nonfocal.   LABORATORY DATA: Initially hemoglobin was 7.5. It went to 8.1. Now he is down to 7.6 this morning. White count is 3.1, platelet count is 81,000. INR is normal. Urinalysis is negative. Sodium 146. Potassium 3.8, chloride 118, glucose 126, calcium is 8.1.  ASSESSMENT AND PLAN: This is a patient with known history of GI bleeding from arteriovenous malformations. It was bright red blood per rectum such as lower gastrointestinal bleeding. Nevertheless, he does have a history of duodenal AVMs that were bleeding that required cauterization. I would expect the bleeding to stop once the Plavix and aspirin is  held. However, because of the aortic valve replacement, patient would likely need to go back on Plavix and aspirin in the near future. If that is the case then he may bleed again. So, I discussed the possibility of doing a colonoscopy and also  repeating the endoscopy so see where the bleeding is coming from. The patient is concerned about recurrent bleeding. Therefore, he went like to have repeat endoscopies done. Therefore, the patient will be placed on a clear liquid diet today and given bowel prep later tonight. Also we will plan on repeating the upper endoscopy and colonoscopy tomorrow.   Thank you for the referral.    ____________________________ Lupita Dawn. Candace Cruise, MD pyo:sg D: 12/06/2013 10:16:25 ET T: 12/06/2013 11:18:50 ET JOB#: [id number]  cc: Lupita Dawn. Candace Cruise, MD, <Dictator> Lupita Dawn Juliyah Mergen MD ELECTRONICALLY SIGNED 12/07/2013 8:58

## 2015-02-02 ENCOUNTER — Other Ambulatory Visit: Payer: Self-pay | Admitting: *Deleted

## 2015-02-02 DIAGNOSIS — D696 Thrombocytopenia, unspecified: Secondary | ICD-10-CM

## 2015-02-02 DIAGNOSIS — D509 Iron deficiency anemia, unspecified: Secondary | ICD-10-CM

## 2015-02-02 DIAGNOSIS — D61818 Other pancytopenia: Secondary | ICD-10-CM

## 2015-02-05 ENCOUNTER — Other Ambulatory Visit: Payer: Self-pay

## 2015-04-02 ENCOUNTER — Other Ambulatory Visit: Payer: Self-pay | Admitting: Internal Medicine

## 2015-04-02 DIAGNOSIS — R936 Abnormal findings on diagnostic imaging of limbs: Secondary | ICD-10-CM

## 2015-04-02 DIAGNOSIS — R937 Abnormal findings on diagnostic imaging of other parts of musculoskeletal system: Secondary | ICD-10-CM

## 2015-04-05 ENCOUNTER — Emergency Department
Admission: EM | Admit: 2015-04-05 | Discharge: 2015-04-05 | Discharge status: Against Medical Advice | Payer: Medicare Other | Attending: Emergency Medicine | Admitting: Emergency Medicine

## 2015-04-05 ENCOUNTER — Encounter: Payer: Self-pay | Admitting: Emergency Medicine

## 2015-04-05 DIAGNOSIS — D689 Coagulation defect, unspecified: Secondary | ICD-10-CM | POA: Insufficient documentation

## 2015-04-05 DIAGNOSIS — E119 Type 2 diabetes mellitus without complications: Secondary | ICD-10-CM | POA: Diagnosis not present

## 2015-04-05 DIAGNOSIS — I10 Essential (primary) hypertension: Secondary | ICD-10-CM | POA: Insufficient documentation

## 2015-04-05 DIAGNOSIS — Z87891 Personal history of nicotine dependence: Secondary | ICD-10-CM | POA: Insufficient documentation

## 2015-04-05 HISTORY — DX: Essential (primary) hypertension: I10

## 2015-04-05 HISTORY — DX: Malignant (primary) neoplasm, unspecified: C80.1

## 2015-04-05 HISTORY — DX: Anemia, unspecified: D64.9

## 2015-04-05 HISTORY — DX: Encounter for other specified aftercare: Z51.89

## 2015-04-05 HISTORY — DX: Type 2 diabetes mellitus without complications: E11.9

## 2015-04-05 HISTORY — DX: Disorder of kidney and ureter, unspecified: N28.9

## 2015-04-05 HISTORY — DX: Hyperlipidemia, unspecified: E78.5

## 2015-04-05 NOTE — ED Notes (Addendum)
Upon closer examination by this RN, patient had small lesion to right testicle (appeared to be a small laceration or insect bite). Area was approx 1 mm in diameter. Bleeding controlled. Patient stated he did not want to stay if bleeding resolved. Patient advised to put pressure on area if bleeding began again. Will follow up with oncologist this coming week. Patient and daughter verbalized understanding. Patient left after triage.

## 2015-04-05 NOTE — ED Notes (Signed)
Patient reports bleeding from groin. Denies any urethral bleeding. States he had femoral accessed to place filter in valve approx one year ago.

## 2015-04-08 ENCOUNTER — Inpatient Hospital Stay: Payer: Medicare Other | Attending: Internal Medicine | Admitting: Internal Medicine

## 2015-04-08 VITALS — BP 157/80 | HR 80 | Temp 97.3°F | Resp 18 | Ht 70.0 in | Wt 153.0 lb

## 2015-04-08 DIAGNOSIS — Z9079 Acquired absence of other genital organ(s): Secondary | ICD-10-CM | POA: Insufficient documentation

## 2015-04-08 DIAGNOSIS — I10 Essential (primary) hypertension: Secondary | ICD-10-CM

## 2015-04-08 DIAGNOSIS — E785 Hyperlipidemia, unspecified: Secondary | ICD-10-CM | POA: Insufficient documentation

## 2015-04-08 DIAGNOSIS — C61 Malignant neoplasm of prostate: Secondary | ICD-10-CM | POA: Diagnosis not present

## 2015-04-08 DIAGNOSIS — Z8719 Personal history of other diseases of the digestive system: Secondary | ICD-10-CM

## 2015-04-08 DIAGNOSIS — D649 Anemia, unspecified: Secondary | ICD-10-CM | POA: Insufficient documentation

## 2015-04-08 DIAGNOSIS — E119 Type 2 diabetes mellitus without complications: Secondary | ICD-10-CM | POA: Diagnosis not present

## 2015-04-08 DIAGNOSIS — M545 Low back pain: Secondary | ICD-10-CM | POA: Insufficient documentation

## 2015-04-08 DIAGNOSIS — Z79899 Other long term (current) drug therapy: Secondary | ICD-10-CM | POA: Insufficient documentation

## 2015-04-08 DIAGNOSIS — Z87891 Personal history of nicotine dependence: Secondary | ICD-10-CM | POA: Diagnosis not present

## 2015-04-08 DIAGNOSIS — Z79818 Long term (current) use of other agents affecting estrogen receptors and estrogen levels: Secondary | ICD-10-CM | POA: Diagnosis not present

## 2015-04-08 NOTE — Progress Notes (Signed)
Patient is referred back to see Dr. Ma Hillock for history of prostate cancer and elevated PSA. Patient states that he has been having some pain in his lower back and knees and occasionally in his shoulder.

## 2015-04-10 ENCOUNTER — Ambulatory Visit
Admission: RE | Admit: 2015-04-10 | Discharge: 2015-04-10 | Disposition: A | Discharge status: Home / Self Care | Payer: Medicare Other | Source: Ambulatory Visit | Attending: Internal Medicine | Admitting: Internal Medicine

## 2015-04-10 DIAGNOSIS — Z8546 Personal history of malignant neoplasm of prostate: Secondary | ICD-10-CM | POA: Insufficient documentation

## 2015-04-10 DIAGNOSIS — N281 Cyst of kidney, acquired: Secondary | ICD-10-CM | POA: Insufficient documentation

## 2015-04-10 DIAGNOSIS — N210 Calculus in bladder: Secondary | ICD-10-CM | POA: Insufficient documentation

## 2015-04-10 DIAGNOSIS — R911 Solitary pulmonary nodule: Secondary | ICD-10-CM | POA: Insufficient documentation

## 2015-04-10 DIAGNOSIS — I2510 Atherosclerotic heart disease of native coronary artery without angina pectoris: Secondary | ICD-10-CM | POA: Diagnosis not present

## 2015-04-10 DIAGNOSIS — K8020 Calculus of gallbladder without cholecystitis without obstruction: Secondary | ICD-10-CM | POA: Diagnosis not present

## 2015-04-10 DIAGNOSIS — C61 Malignant neoplasm of prostate: Secondary | ICD-10-CM

## 2015-04-10 DIAGNOSIS — Z9079 Acquired absence of other genital organ(s): Secondary | ICD-10-CM | POA: Diagnosis not present

## 2015-04-10 MED ORDER — IOHEXOL 300 MG/ML  SOLN
100.0000 mL | Freq: Once | INTRAMUSCULAR | Status: AC | PRN
  Administered 2015-04-10: 100 mL via INTRAVENOUS

## 2015-04-11 ENCOUNTER — Ambulatory Visit
Admission: RE | Admit: 2015-04-11 | Discharge: 2015-04-11 | Disposition: A | Discharge status: Home / Self Care | Payer: Medicare Other | Source: Ambulatory Visit | Attending: Internal Medicine | Admitting: Internal Medicine

## 2015-04-11 ENCOUNTER — Encounter
Admission: RE | Admit: 2015-04-11 | Discharge: 2015-04-11 | Disposition: A | Discharge status: Home / Self Care | Payer: Medicare Other | Source: Ambulatory Visit | Attending: Internal Medicine | Admitting: Internal Medicine

## 2015-04-11 DIAGNOSIS — R937 Abnormal findings on diagnostic imaging of other parts of musculoskeletal system: Secondary | ICD-10-CM | POA: Insufficient documentation

## 2015-04-11 DIAGNOSIS — R936 Abnormal findings on diagnostic imaging of limbs: Secondary | ICD-10-CM

## 2015-04-11 DIAGNOSIS — Z8546 Personal history of malignant neoplasm of prostate: Secondary | ICD-10-CM | POA: Diagnosis present

## 2015-04-11 MED ORDER — TECHNETIUM TC 99M MEDRONATE IV KIT
25.0000 | PACK | Freq: Once | INTRAVENOUS | Status: AC | PRN
  Administered 2015-04-11: 22.99 via INTRAVENOUS

## 2015-04-15 ENCOUNTER — Inpatient Hospital Stay (HOSPITAL_BASED_OUTPATIENT_CLINIC_OR_DEPARTMENT_OTHER): Payer: Medicare Other | Admitting: Internal Medicine

## 2015-04-15 ENCOUNTER — Inpatient Hospital Stay: Payer: Medicare Other

## 2015-04-15 ENCOUNTER — Other Ambulatory Visit: Payer: Medicare Other

## 2015-04-15 ENCOUNTER — Ambulatory Visit: Payer: Medicare Other

## 2015-04-15 ENCOUNTER — Ambulatory Visit: Payer: Medicare Other | Admitting: Internal Medicine

## 2015-04-15 ENCOUNTER — Other Ambulatory Visit: Payer: Self-pay | Admitting: *Deleted

## 2015-04-15 VITALS — BP 137/60 | HR 86 | Temp 96.2°F | Resp 18 | Ht 70.0 in | Wt 155.4 lb

## 2015-04-15 DIAGNOSIS — K8689 Other specified diseases of pancreas: Secondary | ICD-10-CM

## 2015-04-15 DIAGNOSIS — D509 Iron deficiency anemia, unspecified: Secondary | ICD-10-CM

## 2015-04-15 DIAGNOSIS — K869 Disease of pancreas, unspecified: Secondary | ICD-10-CM

## 2015-04-15 DIAGNOSIS — Z7951 Long term (current) use of inhaled steroids: Secondary | ICD-10-CM

## 2015-04-15 DIAGNOSIS — C61 Malignant neoplasm of prostate: Secondary | ICD-10-CM | POA: Insufficient documentation

## 2015-04-15 DIAGNOSIS — C7951 Secondary malignant neoplasm of bone: Secondary | ICD-10-CM | POA: Insufficient documentation

## 2015-04-15 DIAGNOSIS — E119 Type 2 diabetes mellitus without complications: Secondary | ICD-10-CM

## 2015-04-15 DIAGNOSIS — M25561 Pain in right knee: Secondary | ICD-10-CM

## 2015-04-15 DIAGNOSIS — E785 Hyperlipidemia, unspecified: Secondary | ICD-10-CM

## 2015-04-15 DIAGNOSIS — M25562 Pain in left knee: Secondary | ICD-10-CM

## 2015-04-15 DIAGNOSIS — I10 Essential (primary) hypertension: Secondary | ICD-10-CM

## 2015-04-15 DIAGNOSIS — Z79899 Other long term (current) drug therapy: Secondary | ICD-10-CM

## 2015-04-15 LAB — CBC WITH DIFFERENTIAL/PLATELET
BASOS PCT: 2 %
Basophils Absolute: 0.1 10*3/uL (ref 0–0.1)
Eosinophils Absolute: 0.3 10*3/uL (ref 0–0.7)
Eosinophils Relative: 7 %
HEMATOCRIT: 27.4 % — AB (ref 40.0–52.0)
HEMOGLOBIN: 8.9 g/dL — AB (ref 13.0–18.0)
LYMPHS ABS: 0.7 10*3/uL — AB (ref 1.0–3.6)
Lymphocytes Relative: 16 %
MCH: 27.4 pg (ref 26.0–34.0)
MCHC: 32.6 g/dL (ref 32.0–36.0)
MCV: 84 fL (ref 80.0–100.0)
MONOS PCT: 8 %
Monocytes Absolute: 0.3 10*3/uL (ref 0.2–1.0)
NEUTROS ABS: 2.8 10*3/uL (ref 1.4–6.5)
NEUTROS PCT: 67 %
Platelets: 216 10*3/uL (ref 150–440)
RBC: 3.26 MIL/uL — AB (ref 4.40–5.90)
RDW: 15.1 % — ABNORMAL HIGH (ref 11.5–14.5)
WBC: 4.1 10*3/uL (ref 3.8–10.6)

## 2015-04-15 LAB — HEPATIC FUNCTION PANEL
ALBUMIN: 2.9 g/dL — AB (ref 3.5–5.0)
ALT: 12 U/L — ABNORMAL LOW (ref 17–63)
AST: 20 U/L (ref 15–41)
Alkaline Phosphatase: 329 U/L — ABNORMAL HIGH (ref 38–126)
BILIRUBIN TOTAL: 0.5 mg/dL (ref 0.3–1.2)
Bilirubin, Direct: 0.1 mg/dL (ref 0.1–0.5)
Indirect Bilirubin: 0.4 mg/dL (ref 0.3–0.9)
TOTAL PROTEIN: 5.8 g/dL — AB (ref 6.5–8.1)

## 2015-04-15 LAB — CREATININE, SERUM
Creatinine, Ser: 1.1 mg/dL (ref 0.61–1.24)
GFR calc Af Amer: >60 mL/min (ref >60)

## 2015-04-15 LAB — PSA: PSA: 287 ng/mL — AB (ref 0.00–4.00)

## 2015-04-15 LAB — CALCIUM: CALCIUM: 8.1 mg/dL — AB (ref 8.9–10.3)

## 2015-04-15 MED ORDER — DENOSUMAB 120 MG/1.7ML ~~LOC~~ SOLN
120.0000 mg | Freq: Once | SUBCUTANEOUS | Status: AC
  Administered 2015-04-15: 120 mg via SUBCUTANEOUS

## 2015-04-15 MED ORDER — DEGARELIX ACETATE 120 MG ~~LOC~~ SOLR
240.0000 mg | Freq: Once | SUBCUTANEOUS | Status: AC
  Administered 2015-04-15: 240 mg via SUBCUTANEOUS
  Filled 2015-04-15: qty 6

## 2015-04-15 NOTE — Progress Notes (Signed)
Patient is here for follow-up of CT Scan and Bone Scan results. He states that his appetite has been good and he is eating three meals a day. He states that he is having mild pain today, mostly in his knees.

## 2015-04-16 LAB — TESTOSTERONE: Testosterone: 151 ng/dL — ABNORMAL LOW (ref 348–1197)

## 2015-04-16 LAB — CANCER ANTIGEN 19-9: CA 19 9: 7 U/mL (ref 0–35)

## 2015-04-25 ENCOUNTER — Ambulatory Visit (HOSPITAL_COMMUNITY)
Admission: RE | Admit: 2015-04-25 | Discharge: 2015-04-25 | Disposition: A | Discharge status: Home / Self Care | Payer: Medicare Other | Source: Ambulatory Visit | Attending: Internal Medicine | Admitting: Internal Medicine

## 2015-04-25 DIAGNOSIS — C61 Malignant neoplasm of prostate: Secondary | ICD-10-CM | POA: Diagnosis present

## 2015-04-25 DIAGNOSIS — R590 Localized enlarged lymph nodes: Secondary | ICD-10-CM | POA: Insufficient documentation

## 2015-04-25 DIAGNOSIS — K8020 Calculus of gallbladder without cholecystitis without obstruction: Secondary | ICD-10-CM | POA: Insufficient documentation

## 2015-04-25 DIAGNOSIS — C7951 Secondary malignant neoplasm of bone: Secondary | ICD-10-CM | POA: Diagnosis present

## 2015-04-25 DIAGNOSIS — J90 Pleural effusion, not elsewhere classified: Secondary | ICD-10-CM | POA: Insufficient documentation

## 2015-04-25 DIAGNOSIS — K8689 Other specified diseases of pancreas: Secondary | ICD-10-CM

## 2015-04-25 DIAGNOSIS — K869 Disease of pancreas, unspecified: Secondary | ICD-10-CM | POA: Insufficient documentation

## 2015-04-25 MED ORDER — GADOBENATE DIMEGLUMINE 529 MG/ML IV SOLN
40.0000 mL | Freq: Once | INTRAVENOUS | Status: AC
  Administered 2015-04-25: 15 mL via INTRAVENOUS

## 2015-04-25 NOTE — Progress Notes (Signed)
[facility]  Telephone:(336) [phone] Fax:(336) [phone]     ID: Richardine Service OB: [birth year]  MR#: [id number]  EXH#:[id number]  Patient Care Team: Adin Hector, MD as PCP - General (Internal Medicine)  CHIEF COMPLAINT/DIAGNOSIS:  1.  Prior history of prostate cancer status post prostatectomy 1998, now having remarkably abnormal PSA greater than 100 - referred here for oncology evaluation.  2. Iron deficiency anemia likely secondary to recurrent GI blood loss (EGD on 07/16/13 reported 4 small angiodysplasia lesions in the duodenum and had thermal therapy) - on parenteral iron therapy, also gets PRBC transfusion prn.  3. Pancytopenia - mainly anemia with mild thrombocytopenia of unclear etiology.  Bone marrow study January 2011 showed no myelodysplasia, cytogenetics and MDS FISH panel negative.    Workup January 2011 -  Hb 8.5, WBC 3500, ANC 2700, platelets 138000, Ab-retic 69. Ferritin low at 8, TIBC 359, iron saturation low at 8%, serum iron low at 27. October 2007 - WBC 4100 with ANC 3,000, platelet 152,000. Hematocrit 34.5, absolute retic 56. PT, PTT, liver function tests, creatinine, TSH, SIEP, haptoglobin are unremarkable. B12, folate, HBsAg, HCV antibody all unremarkable. Iron studies normal except a low ferritin off 39. LDH 216.   HISTORY OF PRESENT ILLNESS:  Patient returns for continued follow-up, he has been managed here for iron deficiency anemia. Patient has past history of prostate cancer, and recent PSA by Dr.Klein was greater than 100. Clinically patient states appetite has been borderline and he has lost about 10-15 pounds in the last 6 months or so. He denies any new urinary symptoms including difficulty micturition, dysuria or hematuria. States that he does have intermittent low back pain but denies other new bone pains. No new cough, chest pain or hemoptysis.  REVIEW OF SYSTEMS:   ROS As in HPI above. In addition, no fever, chills. No new headaches or  focal weakness.  No new sore throat, cough, shortness of breath, sputum, hemoptysis or chest pain. No dizziness or palpitation. No abdominal pain, constipation, diarrhea, dysuria or hematuria. No new skin rash or bleeding symptoms. No new paresthesias in extremities.    PAST MEDICAL HISTORY: Reviewed. Past Medical History  Diagnosis Date  . Hypertension   . Diabetes mellitus without complication   . Hyperlipidemia   . Renal disorder     Acute kidney disease x1 episode  . Cancer     Prostate (possibly current)  . Blood transfusion without reported diagnosis   . Anemia   November 2008 - stool occult blood positive. EGD and colonoscopy December 2008 showed chronic active gastritis H. pylori positive and Barrett's esophagus, and tubular adenoma in the ascending colon).  Sept 2013 - Colonoscopy with few small angiodysplasias in cecum and had coagulation, and 1 small sessile serrated adenoma removed from sigmoid colon.  EGD reported normal esophagus, stomach and duodenum.  PAST SURGICAL HISTORY: Reviewed. Past Surgical History  Procedure Laterality Date  . Prostatectomy    . Cardiac valve replacement      FAMILY HISTORY: Reviewed. No family history on file.  SOCIAL HISTORY: Reviewed. Social History  Substance Use Topics  . Smoking status: Former Research scientist (life sciences)  . Smokeless tobacco: Not on file  . Alcohol Use: No    No Known Allergies  Current Outpatient Prescriptions  Medication Sig Dispense Refill  . Blood Glucose Monitoring Suppl (ONE TOUCH ULTRA 2) W/DEVICE KIT Use as instructed.    . carvedilol (COREG) 3.125 MG tablet Take by mouth.    . chlorhexidine (PERIDEX) 0.12 %  solution Swish and spit 15 mLs as needed.     Marland Kitchen glipiZIDE (GLUCOTROL XL) 10 MG 24 hr tablet Take by mouth.    Marland Kitchen glucose blood (ONE TOUCH ULTRA TEST) test strip Use 2 (two) times daily. Use as instructed.    . isosorbide mononitrate (IMDUR) 60 MG 24 hr tablet Take by mouth.    Marland Kitchen lisinopril (PRINIVIL,ZESTRIL) 20 MG  tablet Take by mouth.    . lovastatin (MEVACOR) 40 MG tablet TAKE ONE TABLET AT BEDTIME    . sodium fluoride (SF 5000 PLUS) 1.1 % CREA dental cream as needed.      No current facility-administered medications for this visit.    PHYSICAL EXAM: Filed Vitals:   04/08/15 1545  BP: 157/80  Pulse: 80  Temp: 97.3 F (36.3 C)  Resp: 18     Body mass index is 21.95 kg/(m^2).    ECOG FS:2 - Symptomatic, <50% confined to bed  GENERAL: Patient is alert and oriented and in no acute distress. There is no icterus. HEENT: EOMs intact. No cervical lymphadenopathy. CVS: S1S2, regular LUNGS: Bilaterally clear to auscultation, no rhonchi. ABDOMEN: Soft, nontender. No hepatomegaly clinically.  EXTREMITIES: No pedal edema.   LAB RESULTS:    Component Value Date/Time   NA 145 12/08/2013 0424   K 3.9 12/08/2013 0424   CL 114* 12/08/2013 0424   CO2 24 12/08/2013 0424   GLUCOSE 115* 12/08/2013 0424   BUN 14 12/08/2013 0424   CREATININE 1.10 04/15/2015 0828   CREATININE 1.06 12/08/2013 0424   CALCIUM 8.1* 04/15/2015 0828   CALCIUM 8.0* 12/08/2013 0424   PROT 5.8* 04/15/2015 0828   PROT 5.5* 12/05/2013 0848   ALBUMIN 2.9* 04/15/2015 0828   ALBUMIN 3.2* 12/05/2013 0848   AST 20 04/15/2015 0828   AST 21 12/05/2013 0848   ALT 12* 04/15/2015 0828   ALT 17 12/05/2013 0848   ALKPHOS 329* 04/15/2015 0828   ALKPHOS 48 12/05/2013 0848   BILITOT 0.5 04/15/2015 0828   BILITOT 0.5 12/05/2013 0848   GFRNONAA >60 04/15/2015 0828   GFRNONAA >60 12/08/2013 0424   GFRAA >60 04/15/2015 0828   GFRAA >60 12/08/2013 0424    Lab Results  Component Value Date   WBC 4.1 04/15/2015   NEUTROABS 2.8 04/15/2015   HGB 8.9* 04/15/2015   HCT 27.4* 04/15/2015   MCV 84.0 04/15/2015   PLT 216 04/15/2015    STUDIES:    ASSESSMENT / PLAN:   1. Prior history of prostate cancer status post prostatectomy 1998, now having remarkably abnormal PSA greater than 100. Have explained to patient and his daughter present  that this is highly concerning for recurrent metastatic prostate cancer. Will need to get restaging evaluation, we will schedule him for CT scan of the abdomen/pelvis and a bone scan. Will see him back after scans are done and make further plan of management, including decide if there is a lesion accessible to biopsy. 2. Iron-deficiency anemia likely secondary to recurrent GI blood loss (EGD on 07/16/13 reported 4 small angiodysplasia lesions in the duodenum and had thermal therapy)  -  continue to monitor intermittently and consider parenteral iron therapy if he develops recurrent iron deficiency anemia.  3. History of pancytopenia - currently having only anemia. WBC, ANC, and platelet count are all in the normal range. Previous bone marrow biopsy study in January 2011 had shown no myelodysplasia, cytogenics and MDS FISH panel were also negative. Continue to monitor. 4. In between visits, patient advised to call or come  to ER in case of any new progressive anemia symptoms or acute sickness. He is agreeable to this plan.       Leia Alf, MD   04/25/2015 9:39 AM

## 2015-05-01 NOTE — Progress Notes (Signed)
[place]  Telephone:(336) [phone] Fax:(336) [phone]     ID: Colin Harper OB: 04/13/1930  MR#: [id number]  OXB#:[id number]  Patient Care Team: Adin Hector, MD as PCP - General (Internal Medicine)  CHIEF COMPLAINT/DIAGNOSIS:  1.  Metastatic stage IV Prostate cancer (diagnosis based on Prior history of prostate cancer s/p prostatectomy 1998, now PSA of >150, multiple bone mets on Bone Scan of 04/11/15, pelvic adenopathy on CT scan of 04/10/15. Per d/w Radiology, no amenable lesions to biopsy).  2. Iron deficiency anemia likely secondary to recurrent GI blood loss (EGD on 07/16/13 reported 4 small angiodysplasia lesions in the duodenum and had thermal therapy) - on intermittent parenteral iron therapy, also gets PRBC transfusion prn.  3. H/o Pancytopenia - mainly anemia with mild thrombocytopenia of unclear etiology.  Bone marrow study January 2011 showed no myelodysplasia, cytogenetics and MDS FISH panel negative.    Workup January 2011 -  Hb 8.5, WBC 3500, ANC 2700, platelets 138000, Ab-retic 69. Ferritin low at 8, TIBC 359, iron saturation low at 8%, serum iron low at 27. October 2007 - WBC 4100 with ANC 3,000, platelet 152,000. Hematocrit 34.5, absolute retic 56. PT, PTT, liver function tests, creatinine, TSH, SIEP, haptoglobin are unremarkable. B12, folate, HBsAg, HCV antibody all unremarkable. Iron studies normal except a low ferritin off 39. LDH 216.   HISTORY OF PRESENT ILLNESS:  Patient returns for oncology follow-up, he had CT scan and Bone scan which is reporting metastatic disease.  Patient has past history of prostate cancer, and recent PSA by Dr.Klein was >150. States he is trying to eat better.  He denies any new urinary symptoms including difficulty micturition, dysuria or hematuria. States that he does have intermittent low back pain but denies other new bone pains. No new cough, chest pain or hemoptysis.  REVIEW OF SYSTEMS:   ROS  As in HPI above. In  addition, no fevers. No new headaches or focal weakness.  No new sore throat, cough, shortness of breath, sputum, hemoptysis or chest pain. No abdominal pain, constipation, diarrhea, dysuria or hematuria. No new skin rash or bleeding symptoms. No new paresthesias in extremities. PS ECOG 2.   PAST MEDICAL HISTORY: Reviewed. Past Medical History  Diagnosis Date  . Hypertension   . Diabetes mellitus without complication   . Hyperlipidemia   . Renal disorder     Acute kidney disease x1 episode  . Cancer     Prostate (possibly current)  . Blood transfusion without reported diagnosis   . Anemia   November 2008 - stool occult blood positive. EGD and colonoscopy December 2008 showed chronic active gastritis H. pylori positive and Barrett's esophagus, and tubular adenoma in the ascending colon).  Sept 2013 - Colonoscopy with few small angiodysplasias in cecum and had coagulation, and 1 small sessile serrated adenoma removed from sigmoid colon.  EGD reported normal esophagus, stomach and duodenum.  PAST SURGICAL HISTORY: Reviewed. Past Surgical History  Procedure Laterality Date  . Prostatectomy    . Cardiac valve replacement      FAMILY HISTORY: Reviewed. No family history on file.  SOCIAL HISTORY: Reviewed. Social History  Substance Use Topics  . Smoking status: Former Research scientist (life sciences)  . Smokeless tobacco: Not on file  . Alcohol Use: No    No Known Allergies  Current Outpatient Prescriptions  Medication Sig Dispense Refill  . Blood Glucose Monitoring Suppl (ONE TOUCH ULTRA 2) W/DEVICE KIT Use as instructed.    . carvedilol (COREG) 3.125 MG tablet Take by  mouth.    . chlorhexidine (PERIDEX) 0.12 % solution Swish and spit 15 mLs as needed.     Marland Kitchen glipiZIDE (GLUCOTROL XL) 10 MG 24 hr tablet Take by mouth.    Marland Kitchen glucose blood (ONE TOUCH ULTRA TEST) test strip Use 2 (two) times daily. Use as instructed.    . isosorbide mononitrate (IMDUR) 60 MG 24 hr tablet Take by mouth.    Marland Kitchen lisinopril  (PRINIVIL,ZESTRIL) 20 MG tablet Take by mouth.    . lovastatin (MEVACOR) 40 MG tablet TAKE ONE TABLET AT BEDTIME    . sodium fluoride (SF 5000 PLUS) 1.1 % CREA dental cream as needed.      No current facility-administered medications for this visit.    PHYSICAL EXAM: Filed Vitals:   04/15/15 0849  BP: 137/60  Pulse: 86  Temp: 96.2 F (35.7 C)  Resp: 18     Body mass index is 22.3 kg/(m^2).    ECOG FS:2 - Symptomatic, <50% confined to bed  GENERAL: Alert and oriented and in no acute distress. There is no icterus. HEENT: EOMs intact. No cervical lymphadenopathy. CVS: S1S2, regular LUNGS: Bilaterally clear to auscultation, no creps or rhonchi. ABDOMEN: Soft, nontender.   EXTREMITIES: No pedal edema.   LAB RESULTS:    Component Value Date/Time   NA 145 12/08/2013 0424   K 3.9 12/08/2013 0424   CL 114* 12/08/2013 0424   CO2 24 12/08/2013 0424   GLUCOSE 115* 12/08/2013 0424   BUN 14 12/08/2013 0424   CREATININE 1.10 04/15/2015 0828   CREATININE 1.06 12/08/2013 0424   CALCIUM 8.1* 04/15/2015 0828   CALCIUM 8.0* 12/08/2013 0424   PROT 5.8* 04/15/2015 0828   PROT 5.5* 12/05/2013 0848   ALBUMIN 2.9* 04/15/2015 0828   ALBUMIN 3.2* 12/05/2013 0848   AST 20 04/15/2015 0828   AST 21 12/05/2013 0848   ALT 12* 04/15/2015 0828   ALT 17 12/05/2013 0848   ALKPHOS 329* 04/15/2015 0828   ALKPHOS 48 12/05/2013 0848   BILITOT 0.5 04/15/2015 0828   BILITOT 0.5 12/05/2013 0848   GFRNONAA >60 04/15/2015 0828   GFRNONAA >60 12/08/2013 0424   GFRAA >60 04/15/2015 0828   GFRAA >60 12/08/2013 0424    Lab Results  Component Value Date   WBC 4.1 04/15/2015   NEUTROABS 2.8 04/15/2015   HGB 8.9* 04/15/2015   HCT 27.4* 04/15/2015   MCV 84.0 04/15/2015   PLT 216 04/15/2015    STUDIES: 04/10/15 - CT abdomen/pelvis.  IMPRESSION:  1. Widespread patchy confluent sclerotic osseous metastatic disease throughout the visualized skeleton, as described.  2. New mild to moderate left para-aortic  and mild bilateral common iliac lymphadenopathy, likely metastatic.  3. New 1.2 cm hypodense pancreatic head lesion, with a suggestion of associated dilated main pancreatic duct in the pancreatic head. No biliary ductal dilatation. A pancreatic mass protocol MRI abdomen with and without intravenous gadolinium contrast is recommended for further evaluation by imaging criteria, as a pancreatic malignancy cannot be excluded.  4. Slight growth of a large solitary 2.3 cm bladder stone. No hydronephrosis.  5. Cholelithiasis.  04/11/15 - Bone Scan.  FINDINGS: Uptake is noted throughout the bony skeleton. Multiple areas of increased activity are identified consistent with bony metastatic disease. These are scattered throughout the rib cage, spine, pelvic bones and bilateral femurs. Involvement of the shoulder joints is noted bilaterally as well. Scattered its calvarial lesions are noted as well.  IMPRESSION: Diffuse metastatic disease as described   ASSESSMENT / PLAN:   1.  Metastatic stage IV Prostate cancer (diagnosis based on Prior history of prostate cancer s/p prostatectomy 1998, now PSA of >150, multiple bone mets on Bone Scan of 04/11/15, pelvic adenopathy on CT scan of 04/10/15. Per d/w Radiology, no amenable lesions to biopsy)  -  have reviewed scans and d/w patient and his daughter present that findings are most c/w clinically with recurrent metastatic prostate cancer. Have explained that a this stage the malignancy is incurable, treatment offered are with palliative intent only, and plan is to start on hormonal therapy for Androgen Deprivation with Mills Koller (Degarelix) injections. Have explained possible response rates and side effects of treatment with Mills Koller and Delton See, he is agreeable and expressed verbal consent to take this treatment. Will get serum calcium at 4 weeks, 8 weeks and make appt for Xgeva and Firmagon injections on these dates Next MD f/u at 12 weeks with lab, and get [place] and Firmagon  injections 2. Pancreas abnormality on CT scan - will get MRI abdomen (pancreas protocol) on 04/17/15. 3. Iron-deficiency anemia likely secondary to recurrent GI blood loss (EGD on 07/16/13 reported 4 small angiodysplasia lesions in the duodenum and had thermal therapy)  -  continue to monitor intermittently and consider parenteral iron therapy if he develops recurrent iron deficiency anemia.  4. History of pancytopenia - currently having only anemia. WBC, ANC, and platelet count are all in the normal range. Previous bone marrow biopsy study in January 2011 had shown no myelodysplasia, cytogenics and MDS FISH panel were also negative. Continue to monitor. 5. In between visits, patient advised to call or come to ER in case of any new progressive anemia symptoms or acute sickness. He is agreeable to this plan.       Leia Alf, MD   05/01/2015 9:44 AM

## 2015-05-13 ENCOUNTER — Inpatient Hospital Stay: Payer: Medicare Other | Attending: Internal Medicine

## 2015-05-13 ENCOUNTER — Inpatient Hospital Stay: Payer: Medicare Other

## 2015-05-13 VITALS — BP 134/62 | HR 82 | Temp 96.4°F | Resp 18

## 2015-05-13 DIAGNOSIS — Z79818 Long term (current) use of other agents affecting estrogen receptors and estrogen levels: Secondary | ICD-10-CM | POA: Diagnosis not present

## 2015-05-13 DIAGNOSIS — C61 Malignant neoplasm of prostate: Secondary | ICD-10-CM | POA: Insufficient documentation

## 2015-05-13 DIAGNOSIS — C7951 Secondary malignant neoplasm of bone: Principal | ICD-10-CM

## 2015-05-13 LAB — CALCIUM: Calcium: 7.2 mg/dL — ABNORMAL LOW (ref 8.9–10.3)

## 2015-05-13 MED ORDER — DEGARELIX ACETATE 80 MG ~~LOC~~ SOLR
80.0000 mg | Freq: Once | SUBCUTANEOUS | Status: AC
  Administered 2015-05-13: 80 mg via SUBCUTANEOUS
  Filled 2015-05-13: qty 4

## 2015-05-14 ENCOUNTER — Other Ambulatory Visit: Payer: Self-pay | Admitting: Internal Medicine

## 2015-05-28 ENCOUNTER — Inpatient Hospital Stay: Payer: Medicare Other | Attending: Internal Medicine

## 2015-05-28 ENCOUNTER — Inpatient Hospital Stay: Payer: Medicare Other | Admitting: Internal Medicine

## 2015-05-28 DIAGNOSIS — D509 Iron deficiency anemia, unspecified: Secondary | ICD-10-CM | POA: Diagnosis not present

## 2015-05-28 DIAGNOSIS — C61 Malignant neoplasm of prostate: Secondary | ICD-10-CM | POA: Insufficient documentation

## 2015-05-28 DIAGNOSIS — Z79899 Other long term (current) drug therapy: Secondary | ICD-10-CM | POA: Diagnosis not present

## 2015-05-28 DIAGNOSIS — C7951 Secondary malignant neoplasm of bone: Secondary | ICD-10-CM | POA: Diagnosis not present

## 2015-05-28 DIAGNOSIS — D61818 Other pancytopenia: Secondary | ICD-10-CM

## 2015-05-28 DIAGNOSIS — D696 Thrombocytopenia, unspecified: Secondary | ICD-10-CM

## 2015-05-28 LAB — CBC WITH DIFFERENTIAL/PLATELET
BASOS ABS: 0 10*3/uL (ref 0–0.1)
Basophils Relative: 2 %
Eosinophils Absolute: 0.1 10*3/uL (ref 0–0.7)
Eosinophils Relative: 5 %
HEMATOCRIT: 26.8 % — AB (ref 40.0–52.0)
Hemoglobin: 8.5 g/dL — ABNORMAL LOW (ref 13.0–18.0)
LYMPHS PCT: 16 %
Lymphs Abs: 0.4 10*3/uL — ABNORMAL LOW (ref 1.0–3.6)
MCH: 25.3 pg — ABNORMAL LOW (ref 26.0–34.0)
MCHC: 31.6 g/dL — ABNORMAL LOW (ref 32.0–36.0)
MCV: 80.1 fL (ref 80.0–100.0)
MONO ABS: 0.3 10*3/uL (ref 0.2–1.0)
Monocytes Relative: 11 %
NEUTROS ABS: 1.9 10*3/uL (ref 1.4–6.5)
Neutrophils Relative %: 66 %
Platelets: 147 10*3/uL — ABNORMAL LOW (ref 150–440)
RBC: 3.35 MIL/uL — ABNORMAL LOW (ref 4.40–5.90)
RDW: 15.4 % — AB (ref 11.5–14.5)
WBC: 2.8 10*3/uL — ABNORMAL LOW (ref 3.8–10.6)

## 2015-05-28 LAB — FERRITIN: Ferritin: 13 ng/mL — ABNORMAL LOW (ref 24–336)

## 2015-05-28 LAB — IRON AND TIBC
Iron: 22 ug/dL — ABNORMAL LOW (ref 45–182)
SATURATION RATIOS: 7 % — AB (ref 17.9–39.5)
TIBC: 323 ug/dL (ref 250–450)
UIBC: 301 ug/dL

## 2015-05-29 ENCOUNTER — Telehealth: Payer: Self-pay | Admitting: *Deleted

## 2015-05-29 NOTE — Telephone Encounter (Signed)
Called and left message that pt needs iron in 2 doses 4 hours each and wanted to know if she could call back and see what time they could come.  I did give her the levels of iron over the phone in the message

## 2015-05-30 ENCOUNTER — Other Ambulatory Visit: Payer: Self-pay | Admitting: Internal Medicine

## 2015-05-30 DIAGNOSIS — C7951 Secondary malignant neoplasm of bone: Principal | ICD-10-CM

## 2015-05-30 DIAGNOSIS — C61 Malignant neoplasm of prostate: Secondary | ICD-10-CM

## 2015-06-06 ENCOUNTER — Inpatient Hospital Stay: Payer: Medicare Other

## 2015-06-06 DIAGNOSIS — C61 Malignant neoplasm of prostate: Secondary | ICD-10-CM

## 2015-06-06 DIAGNOSIS — C7951 Secondary malignant neoplasm of bone: Principal | ICD-10-CM

## 2015-06-06 MED ORDER — SODIUM CHLORIDE 0.9 % IV SOLN
INTRAVENOUS | Status: DC
  Administered 2015-06-06: 10:00:00 via INTRAVENOUS
  Filled 2015-06-06: qty 1000

## 2015-06-06 MED ORDER — SODIUM CHLORIDE 0.9 % IV SOLN
500.0000 mg | Freq: Once | INTRAVENOUS | Status: AC
  Administered 2015-06-06: 500 mg via INTRAVENOUS
  Filled 2015-06-06: qty 25

## 2015-06-10 ENCOUNTER — Inpatient Hospital Stay: Payer: Medicare Other

## 2015-06-10 DIAGNOSIS — C61 Malignant neoplasm of prostate: Secondary | ICD-10-CM

## 2015-06-10 DIAGNOSIS — C7951 Secondary malignant neoplasm of bone: Principal | ICD-10-CM

## 2015-06-10 LAB — CALCIUM: CALCIUM: 7.5 mg/dL — AB (ref 8.9–10.3)

## 2015-06-10 MED ORDER — DEGARELIX ACETATE 80 MG ~~LOC~~ SOLR
80.0000 mg | Freq: Once | SUBCUTANEOUS | Status: AC
  Administered 2015-06-10: 80 mg via SUBCUTANEOUS
  Filled 2015-06-10: qty 4

## 2015-06-10 MED ORDER — DENOSUMAB 120 MG/1.7ML ~~LOC~~ SOLN
120.0000 mg | Freq: Once | SUBCUTANEOUS | Status: AC
  Administered 2015-06-10: 120 mg via SUBCUTANEOUS
  Filled 2015-06-10: qty 1.7

## 2015-06-20 ENCOUNTER — Inpatient Hospital Stay: Payer: Medicare Other

## 2015-06-20 VITALS — BP 153/57 | HR 60 | Resp 20

## 2015-06-20 DIAGNOSIS — C61 Malignant neoplasm of prostate: Secondary | ICD-10-CM | POA: Diagnosis not present

## 2015-06-20 DIAGNOSIS — C7951 Secondary malignant neoplasm of bone: Principal | ICD-10-CM

## 2015-06-20 MED ORDER — SODIUM CHLORIDE 0.9 % IV SOLN
500.0000 mg | Freq: Once | INTRAVENOUS | Status: AC
  Administered 2015-06-20: 500 mg via INTRAVENOUS
  Filled 2015-06-20: qty 25

## 2015-06-20 MED ORDER — SODIUM CHLORIDE 0.9 % IV SOLN
INTRAVENOUS | Status: DC
  Administered 2015-06-20: 09:00:00 via INTRAVENOUS
  Filled 2015-06-20: qty 1000

## 2015-07-08 ENCOUNTER — Inpatient Hospital Stay: Payer: Medicare Other

## 2015-07-08 ENCOUNTER — Encounter: Payer: Self-pay | Admitting: Internal Medicine

## 2015-07-08 ENCOUNTER — Inpatient Hospital Stay: Payer: Medicare Other | Attending: Internal Medicine | Admitting: Internal Medicine

## 2015-07-08 ENCOUNTER — Other Ambulatory Visit: Payer: Self-pay | Admitting: *Deleted

## 2015-07-08 VITALS — BP 111/66 | HR 80 | Temp 98.2°F | Resp 18 | Ht 70.0 in | Wt 152.1 lb

## 2015-07-08 DIAGNOSIS — Z8719 Personal history of other diseases of the digestive system: Secondary | ICD-10-CM | POA: Diagnosis not present

## 2015-07-08 DIAGNOSIS — C7951 Secondary malignant neoplasm of bone: Secondary | ICD-10-CM | POA: Diagnosis not present

## 2015-07-08 DIAGNOSIS — C61 Malignant neoplasm of prostate: Secondary | ICD-10-CM | POA: Diagnosis not present

## 2015-07-08 DIAGNOSIS — Z9079 Acquired absence of other genital organ(s): Secondary | ICD-10-CM | POA: Insufficient documentation

## 2015-07-08 DIAGNOSIS — E119 Type 2 diabetes mellitus without complications: Secondary | ICD-10-CM | POA: Diagnosis not present

## 2015-07-08 DIAGNOSIS — Z79818 Long term (current) use of other agents affecting estrogen receptors and estrogen levels: Secondary | ICD-10-CM | POA: Diagnosis not present

## 2015-07-08 DIAGNOSIS — Z79899 Other long term (current) drug therapy: Secondary | ICD-10-CM | POA: Diagnosis not present

## 2015-07-08 DIAGNOSIS — K869 Disease of pancreas, unspecified: Secondary | ICD-10-CM | POA: Diagnosis not present

## 2015-07-08 DIAGNOSIS — D509 Iron deficiency anemia, unspecified: Secondary | ICD-10-CM | POA: Insufficient documentation

## 2015-07-08 DIAGNOSIS — M25561 Pain in right knee: Secondary | ICD-10-CM | POA: Insufficient documentation

## 2015-07-08 DIAGNOSIS — I10 Essential (primary) hypertension: Secondary | ICD-10-CM

## 2015-07-08 DIAGNOSIS — Z87891 Personal history of nicotine dependence: Secondary | ICD-10-CM | POA: Diagnosis not present

## 2015-07-08 DIAGNOSIS — E785 Hyperlipidemia, unspecified: Secondary | ICD-10-CM | POA: Diagnosis not present

## 2015-07-08 LAB — CBC WITH DIFFERENTIAL/PLATELET
BASOS PCT: 1 %
Basophils Absolute: 0 10*3/uL (ref 0–0.1)
EOS ABS: 0.2 10*3/uL (ref 0–0.7)
Eosinophils Relative: 5 %
HEMATOCRIT: 30.4 % — AB (ref 40.0–52.0)
HEMOGLOBIN: 9.5 g/dL — AB (ref 13.0–18.0)
Lymphocytes Relative: 12 %
Lymphs Abs: 0.5 10*3/uL — ABNORMAL LOW (ref 1.0–3.6)
MCH: 25.8 pg — ABNORMAL LOW (ref 26.0–34.0)
MCHC: 31.3 g/dL — ABNORMAL LOW (ref 32.0–36.0)
MCV: 82.3 fL (ref 80.0–100.0)
Monocytes Absolute: 0.4 10*3/uL (ref 0.2–1.0)
Monocytes Relative: 10 %
NEUTROS ABS: 3.1 10*3/uL (ref 1.4–6.5)
NEUTROS PCT: 72 %
Platelets: 156 10*3/uL (ref 150–440)
RBC: 3.69 MIL/uL — AB (ref 4.40–5.90)
RDW: 21.7 % — ABNORMAL HIGH (ref 11.5–14.5)
WBC: 4.3 10*3/uL (ref 3.8–10.6)

## 2015-07-08 LAB — HEPATIC FUNCTION PANEL
ALBUMIN: 3.5 g/dL (ref 3.5–5.0)
ALT: 17 U/L (ref 17–63)
AST: 25 U/L (ref 15–41)
Alkaline Phosphatase: 259 U/L — ABNORMAL HIGH (ref 38–126)
BILIRUBIN INDIRECT: 0.7 mg/dL (ref 0.3–0.9)
Bilirubin, Direct: 0.1 mg/dL (ref 0.1–0.5)
TOTAL PROTEIN: 6.2 g/dL — AB (ref 6.5–8.1)
Total Bilirubin: 0.8 mg/dL (ref 0.3–1.2)

## 2015-07-08 LAB — CALCIUM: Calcium: 8.2 mg/dL — ABNORMAL LOW (ref 8.9–10.3)

## 2015-07-08 LAB — PSA: PSA: 174 ng/mL — ABNORMAL HIGH (ref 0.00–4.00)

## 2015-07-08 LAB — IRON AND TIBC
IRON: 39 ug/dL — AB (ref 45–182)
SATURATION RATIOS: 14 % — AB (ref 17.9–39.5)
TIBC: 272 ug/dL (ref 250–450)
UIBC: 233 ug/dL

## 2015-07-08 LAB — FERRITIN: FERRITIN: 124 ng/mL (ref 24–336)

## 2015-07-08 LAB — CREATININE, SERUM
CREATININE: 1.5 mg/dL — AB (ref 0.61–1.24)
GFR, EST AFRICAN AMERICAN: 48 mL/min — AB (ref >60)
GFR, EST NON AFRICAN AMERICAN: 41 mL/min — AB (ref >60)

## 2015-07-08 MED ORDER — DEGARELIX ACETATE 80 MG ~~LOC~~ SOLR
80.0000 mg | Freq: Once | SUBCUTANEOUS | Status: AC
  Administered 2015-07-08: 80 mg via SUBCUTANEOUS
  Filled 2015-07-08: qty 4

## 2015-07-08 MED ORDER — DENOSUMAB 120 MG/1.7ML ~~LOC~~ SOLN
120.0000 mg | Freq: Once | SUBCUTANEOUS | Status: AC
  Administered 2015-07-08: 120 mg via SUBCUTANEOUS
  Filled 2015-07-08: qty 1.7

## 2015-07-08 NOTE — Progress Notes (Signed)
Pt ready for treatment, feels like he drinks and eats good, no problems with bowels. No pain today

## 2015-07-08 NOTE — Progress Notes (Addendum)
[place]  [phone] [phone]     ID: Colin Harper Service OB: 79/09/1929  MR#: [id number]  JYN#:[id number]  Patient Care Team: Adin Hector, MD as PCP - General (Internal Medicine)  CHIEF COMPLAINT/DIAGNOSIS/HPI:  1.  Metastatic stage IV Prostate cancer (diagnosis based on Prior history of prostate cancer s/p prostatectomy 1998, now PSA of >150, multiple bone mets on Bone Scan of 04/11/15, pelvic adenopathy on CT scan of 04/10/15. Per d/w Radiology, no amenable lesions to biopsy). Treatment with Delton See and Degarelix [place] [facility]) started in 03/2015.  2. Iron deficiency anemia likely secondary to recurrent GI blood loss (EGD on 07/16/13 reported 4 small angiodysplasia lesions in the duodenum and had thermal therapy) - on intermittent parenteral iron therapy, also gets PRBC transfusion prn.  3. H/o Pancytopenia - mainly anemia with mild thrombocytopenia of unclear etiology.  Bone marrow study January 2011 showed no myelodysplasia, cytogenetics and MDS FISH panel negative. Vitamin B12 and folate levels are normal in 2016   Workup January 2011 -  Hb 8.5, WBC 3500, ANC 2700, platelets 138000, Ab-retic 69. Ferritin low at 8, TIBC 359, iron saturation low at 8%, serum iron low at 27. October 2007 - WBC 4100 with ANC 3,000, platelet 152,000. Hematocrit 34.5, absolute retic 56. PT, PTT, liver function tests, creatinine, TSH, SIEP, haptoglobin are unremarkable. B12, folate, HBsAg, HCV antibody all unremarkable. Iron studies normal except a low ferritin off 39. LDH 216.   HISTORY OF PRESENT ILLNESS:  Mr. [name] returns to our clinic for a follow-up visit. She has been able to tolerate treatment with Delton See and did Kerlix very well without any new symptoms or concerns. He has been able to perform activities of daily living related to self-care as well as slight daily activities. He denies any significant pains except for occasional right knee pain at nighttime. His appetite  is good, he has been able to take calcium supplementation without any significant side effects as well. She is weight has been stable. He denies nausea, vomiting, diarrhea, constipation, shortness of breath, bleeding from any source. He has to urinate at least 3 times at nighttime.  REVIEW OF SYSTEMS:   ROS  As in HPI above. PS ECOG 2.   PAST MEDICAL HISTORY: Reviewed. Past Medical History  Diagnosis Date  . Hypertension   . Diabetes mellitus without complication ([place])   . Hyperlipidemia   . Renal disorder     Acute kidney disease x1 episode  . Cancer [facility])     Prostate (possibly current)  . Blood transfusion without reported diagnosis   . Anemia   November 2008 - stool occult blood positive. EGD and colonoscopy December 2008 showed chronic active gastritis H. pylori positive and Barrett's esophagus, and tubular adenoma in the ascending colon).  Sept 2013 - Colonoscopy with few small angiodysplasias in cecum and had coagulation, and 1 small sessile serrated adenoma removed from sigmoid colon.  EGD reported normal esophagus, stomach and duodenum.  PAST SURGICAL HISTORY: Reviewed. Past Surgical History  Procedure Laterality Date  . Prostatectomy    . Cardiac valve replacement      FAMILY HISTORY: Reviewed. History reviewed. No pertinent family history.  SOCIAL HISTORY: Reviewed. Social History  Substance Use Topics  . Smoking status: Former Research scientist (life sciences)  . Smokeless tobacco: Never Used  . Alcohol Use: No    No Known Allergies  Current Outpatient Prescriptions  Medication Sig Dispense Refill  . Blood Glucose Monitoring Suppl (ONE TOUCH ULTRA 2) W/DEVICE KIT Use as instructed.    Marland Kitchen  calcium carbonate (TUMS - DOSED IN MG ELEMENTAL CALCIUM) 500 MG chewable tablet Chew 1 tablet by mouth 3 (three) times daily.    . carvedilol (COREG) 3.125 MG tablet Take by mouth.    Marland Kitchen glipiZIDE (GLUCOTROL XL) 10 MG 24 hr tablet Take by mouth.    . isosorbide mononitrate (IMDUR) 60 MG 24 hr tablet  Take by mouth.    . vitamin B-12 (CYANOCOBALAMIN) 1000 MCG tablet Take 1,000 mcg by mouth daily.    . Vitamin D, Cholecalciferol, 1000 UNITS TABS Take 1 Dose by mouth 2 (two) times daily.    Marland Kitchen glucose blood (ONE TOUCH ULTRA TEST) test strip Use 2 (two) times daily. Use as instructed.    Marland Kitchen lisinopril (PRINIVIL,ZESTRIL) 20 MG tablet Take by mouth.     No current facility-administered medications for this visit.   Facility-Administered Medications Ordered in Other Visits  Medication Dose Route Frequency Provider Last Rate Last Dose  . degarelix (FIRMAGON) injection 80 mg  80 mg Subcutaneous Once Candida Vetter, MD        PHYSICAL EXAM: Filed Vitals:   07/08/15 1041  BP: 111/66  Pulse: 80  Temp: 98.2 F (36.8 C)  Resp: 18     Body mass index is 21.83 kg/(m^2).    ECOG FS:2 - Symptomatic, <50% confined to bed  BP 111/66 mmHg  Pulse 80  Temp(Src) 98.2 F (36.8 C) (Oral)  Resp 18  Ht _0  (1.778 m)  Wt 152 lb 1.9 oz (69 kg)  BMI 21.83 kg/m2  General Appearance:    Alert, cooperative, no distress, appears stated age, elderly Caucasian male, extremely hard of hearing   Head:    Normocephalic, without obvious abnormality, atraumatic, mouth-upper and lower dentures   Eyes:    PERRL, conjunctiva/corneas clear, EOM's intact, fundi    benign, both eyes       Ears:    Normal TM's and external ear canals, both ears  Nose:   Nares normal, septum midline, mucosa normal, no drainage   or sinus tenderness  Throat:   Lips, mucosa, and tongue normal; teeth and gums normal  Neck:   Supple, symmetrical, trachea midline, no adenopathy;       thyroid:  No enlargement/tenderness/nodules; no carotid   bruit or JVD  Back:     Symmetric, no curvature, ROM normal, no CVA tenderness  Lungs:     Clear to auscultation bilaterally, respirations unlabored  Chest wall:    No tenderness or deformity  Heart:    Regular rate and rhythm, S1 and S2 normal, no murmur, rub   or gallop  Abdomen:     Soft,  non-tender, bowel sounds active all four quadrants,    no masses, no organomegaly  Rectal:    deferred by the patient   Extremities:   Extremities normal, atraumatic, no cyanosis or edema  Pulses:   2+ and symmetric all extremities  Skin:   Skin color, texture, turgor normal, no rashes or lesions  Lymph nodes:   Cervical, supraclavicular, and axillary nodes normal  Neurologic:   CNII-XII intact. Normal strength, sensation and reflexes      throughout      LAB RESULTS: Recent Results (from the past 2160 hour(s))  CBC with Differential     Status: Abnormal   Collection Time: 04/15/15  8:28 AM  Result Value Ref Range   WBC 4.1 3.8 - 10.6 K/uL   RBC 3.26 (L) 4.40 - 5.90 MIL/uL   Hemoglobin 8.9 (L) 13.0 -  18.0 g/dL   HCT 27.4 (L) 40.0 - 52.0 %   MCV 84.0 80.0 - 100.0 fL   MCH 27.4 26.0 - 34.0 pg   MCHC 32.6 32.0 - 36.0 g/dL   RDW 15.1 (H) 11.5 - 14.5 %   Platelets 216 150 - 440 K/uL   Neutrophils Relative % 67 %   Neutro Abs 2.8 1.4 - 6.5 K/uL   Lymphocytes Relative 16 %   Lymphs Abs 0.7 (L) 1.0 - 3.6 K/uL   Monocytes Relative 8 %   Monocytes Absolute 0.3 0.2 - 1.0 K/uL   Eosinophils Relative 7 %   Eosinophils Absolute 0.3 0 - 0.7 K/uL   Basophils Relative 2 %   Basophils Absolute 0.1 0 - 0.1 K/uL  Creatinine, serum     Status: None   Collection Time: 04/15/15  8:28 AM  Result Value Ref Range   Creatinine, Ser 1.10 0.61 - 1.24 mg/dL   GFR calc non Af Amer >60 >60 mL/min   GFR calc Af Amer >60 >60 mL/min    Comment: (NOTE) The eGFR has been calculated using the CKD EPI equation. This calculation has not been validated in all clinical situations. eGFR's persistently <60 mL/min signify possible Chronic Kidney Disease.   Hepatic function panel     Status: Abnormal   Collection Time: 04/15/15  8:28 AM  Result Value Ref Range   Total Protein 5.8 (L) 6.5 - 8.1 g/dL   Albumin 2.9 (L) 3.5 - 5.0 g/dL   AST 20 15 - 41 U/L   ALT 12 (L) 17 - 63 U/L   Alkaline Phosphatase 329 (H)  38 - 126 U/L   Total Bilirubin 0.5 0.3 - 1.2 mg/dL   Bilirubin, Direct 0.1 0.1 - 0.5 mg/dL   Indirect Bilirubin 0.4 0.3 - 0.9 mg/dL  Testosterone     Status: Abnormal   Collection Time: 04/15/15  8:28 AM  Result Value Ref Range   Testosterone 151 (L) 348 - 1197 ng/dL   Comment, Testosterone Comment     Comment: (NOTE) Adult male reference interval is based on a population of lean males up to 79 years old. Performed At: [facility] [place], [place] [id number] Lindon Romp MD [id number]   Cancer antigen 19-9     Status: None   Collection Time: 04/15/15  8:28 AM  Result Value Ref Range   CA 19-9 7 0 - 35 U/mL    Comment: (NOTE) Roche ECLIA methodology Performed At: [facility] [place], [place] [id number] Lindon Romp MD [id number]   Calcium     Status: Abnormal   Collection Time: 04/15/15  8:28 AM  Result Value Ref Range   Calcium 8.1 (L) 8.9 - 10.3 mg/dL  PSA     Status: Abnormal   Collection Time: 04/15/15  8:28 AM  Result Value Ref Range   PSA 287.00 (H) 0.00 - 4.00 ng/mL    Comment: (NOTE) While PSA levels of <=4.0 ng/ml are reported as reference range, some men with levels below 4.0 ng/ml can have prostate cancer and many men with PSA above 4.0 ng/ml do not have prostate cancer.  Other tests such as free PSA, age specific reference ranges, PSA velocity and PSA doubling time may be helpful especially in men less than 39 years old. Performed at [facility]   Calcium     Status: Abnormal   Collection Time: 05/13/15 10:49 AM  Result Value Ref Range   Calcium  7.2 (L) 8.9 - 10.3 mg/dL  CBC with Differential/Platelet     Status: Abnormal   Collection Time: 05/28/15 10:52 AM  Result Value Ref Range   WBC 2.8 (L) 3.8 - 10.6 K/uL   RBC 3.35 (L) 4.40 - 5.90 MIL/uL   Hemoglobin 8.5 (L) 13.0 - 18.0 g/dL   HCT 26.8 (L) 40.0 - 52.0 %   MCV 80.1 80.0 - 100.0 fL   MCH 25.3 (L) 26.0 - 34.0 pg   MCHC 31.6 (L)  32.0 - 36.0 g/dL   RDW 15.4 (H) 11.5 - 14.5 %   Platelets 147 (L) 150 - 440 K/uL   Neutrophils Relative % 66 %   Neutro Abs 1.9 1.4 - 6.5 K/uL   Lymphocytes Relative 16 %   Lymphs Abs 0.4 (L) 1.0 - 3.6 K/uL   Monocytes Relative 11 %   Monocytes Absolute 0.3 0.2 - 1.0 K/uL   Eosinophils Relative 5 %   Eosinophils Absolute 0.1 0 - 0.7 K/uL   Basophils Relative 2 %   Basophils Absolute 0.0 0 - 0.1 K/uL  Ferritin     Status: Abnormal   Collection Time: 05/28/15 10:52 AM  Result Value Ref Range   Ferritin 13 (L) 24 - 336 ng/mL  Iron and TIBC     Status: Abnormal   Collection Time: 05/28/15 10:52 AM  Result Value Ref Range   Iron 22 (L) 45 - 182 ug/dL   TIBC 323 250 - 450 ug/dL   Saturation Ratios 7 (L) 17.9 - 39.5 %   UIBC 301 ug/dL  Calcium     Status: Abnormal   Collection Time: 06/10/15 10:27 AM  Result Value Ref Range   Calcium 7.5 (L) 8.9 - 10.3 mg/dL  Calcium     Status: Abnormal   Collection Time: 07/08/15  9:34 AM  Result Value Ref Range   Calcium 8.2 (L) 8.9 - 10.3 mg/dL  CBC with Differential/Platelet     Status: Abnormal   Collection Time: 07/08/15  9:38 AM  Result Value Ref Range   WBC 4.3 3.8 - 10.6 K/uL   RBC 3.69 (L) 4.40 - 5.90 MIL/uL   Hemoglobin 9.5 (L) 13.0 - 18.0 g/dL   HCT 30.4 (L) 40.0 - 52.0 %   MCV 82.3 80.0 - 100.0 fL   MCH 25.8 (L) 26.0 - 34.0 pg   MCHC 31.3 (L) 32.0 - 36.0 g/dL   RDW 21.7 (H) 11.5 - 14.5 %   Platelets 156 150 - 440 K/uL   Neutrophils Relative % 72 %   Neutro Abs 3.1 1.4 - 6.5 K/uL   Lymphocytes Relative 12 %   Lymphs Abs 0.5 (L) 1.0 - 3.6 K/uL   Monocytes Relative 10 %   Monocytes Absolute 0.4 0.2 - 1.0 K/uL   Eosinophils Relative 5 %   Eosinophils Absolute 0.2 0 - 0.7 K/uL   Basophils Relative 1 %   Basophils Absolute 0.0 0 - 0.1 K/uL  Creatinine, serum     Status: Abnormal   Collection Time: 07/08/15  9:38 AM  Result Value Ref Range   Creatinine, Ser 1.50 (H) 0.61 - 1.24 mg/dL   GFR calc non Af Amer 41 (L) >60 mL/min     GFR calc Af Amer 48 (L) >60 mL/min    Comment: (NOTE) The eGFR has been calculated using the CKD EPI equation. This calculation has not been validated in all clinical situations. eGFR's persistently <60 mL/min signify possible Chronic Kidney Disease.   Ferritin  Status: None   Collection Time: 07/08/15  9:38 AM  Result Value Ref Range   Ferritin 124 24 - 336 ng/mL  Hepatic function panel     Status: Abnormal   Collection Time: 07/08/15  9:38 AM  Result Value Ref Range   Total Protein 6.2 (L) 6.5 - 8.1 g/dL   Albumin 3.5 3.5 - 5.0 g/dL   AST 25 15 - 41 U/L   ALT 17 17 - 63 U/L   Alkaline Phosphatase 259 (H) 38 - 126 U/L   Total Bilirubin 0.8 0.3 - 1.2 mg/dL   Bilirubin, Direct 0.1 0.1 - 0.5 mg/dL   Indirect Bilirubin 0.7 0.3 - 0.9 mg/dL  Iron and TIBC     Status: Abnormal   Collection Time: 07/08/15  9:38 AM  Result Value Ref Range   Iron 39 (L) 45 - 182 ug/dL   TIBC 272 250 - 450 ug/dL   Saturation Ratios 14 (L) 17.9 - 39.5 %   UIBC 233 ug/dL    Lab Results  Component Value Date   WBC 4.3 07/08/2015   NEUTROABS 3.1 07/08/2015   HGB 9.5* 07/08/2015   HCT 30.4* 07/08/2015   MCV 82.3 07/08/2015   PLT 156 07/08/2015    STUDIES: 04/10/15 - CT abdomen/pelvis.  IMPRESSION:  1. Widespread patchy confluent sclerotic osseous metastatic disease throughout the visualized skeleton, as described.  2. New mild to moderate left para-aortic and mild bilateral common iliac lymphadenopathy, likely metastatic.  3. New 1.2 cm hypodense pancreatic head lesion, with a suggestion of associated dilated main pancreatic duct in the pancreatic head. No biliary ductal dilatation. A pancreatic mass protocol MRI abdomen with and without intravenous gadolinium contrast is recommended for further evaluation by imaging criteria, as a pancreatic malignancy cannot be excluded.  4. Slight growth of a large solitary 2.3 cm bladder stone. No hydronephrosis.  5. Cholelithiasis.  04/11/15 - Bone Scan.   FINDINGS: Uptake is noted throughout the bony skeleton. Multiple areas of increased activity are identified consistent with bony metastatic disease. These are scattered throughout the rib cage, spine, pelvic bones and bilateral femurs. Involvement of the shoulder joints is noted bilaterally as well. Scattered its calvarial lesions are noted as well.  IMPRESSION: Diffuse metastatic disease as described  04/25/2015-MRI of abdomen with pancreatic protocol:IMPRESSION: 1. The clustered cystic lesion of concern in the pancreatic head region appears to be along a branch of the dorsal pancreatic duct extending to the minor papilla, as product of a partial pancreas divisum. 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 Follow up MR imaging in 6 months time is recommended. This recommendation follows ACR consensus guidelines: Managing Incidental Findings on Abdominal CT: White Paper of the ACR Incidental Findings Committee. J Am Coll Radiol 2010;7:754-773 2. Enhancing nonspecific lesion in the dome of segment 8 of the liver, 2.6 cm in diameter. Specificity reduced by severe motion artifact. I am doubtful that this is a hemangioma and is not thought to represent a cyst. This might be a tricky biopsy given the proximity to the diaphragm. This could also be reassessed at the time of follow up pancreatic MRI. 3. Trace right pleural effusion. 4. Splenorenal shunt. 5. Enlarged left periaortic lymph node at 1.4 cm in short axis, stable. 6. Extensive osseous metastatic disease. 7. The gallbladder is severely full of gallstones, to the point where porcelain gallbladder cannot be readily  excluded.   ASSESSMENT / PLAN:   1. Metastatic stage IV Prostate cancer (diagnosis based on Prior history of prostate cancer s/p prostatectomy 1998, now PSA of >150,  multiple bone mets on Bone Scan of 04/11/15, pelvic adenopathy on CT scan of 04/10/15. Per d/w Radiology, no amenable lesions to biopsy)  -  he is currently on androgen deprivation therapy, started in August 2016, along with Xgeva. We will check PSA level along with testosterone level to ensure with castrate levels of testosterone are maintained. We will continue to administer this treatment until the progression of the disease. 2. 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 If clinical status remain stable, we will repeat MRI in 6 months.  3. Iron-deficiency anemia likely secondary to recurrent GI blood loss (EGD on 07/16/13 reported 4 small angiodysplasia lesions in the duodenum and had thermal therapy)  -  continue to monitor intermittently and consider parenteral iron therapy if he develops recurrent iron deficiency anemia. Iron profile has normalized after IV infusion of venofer. Hemoglobin, however, remains fairly low, likely secondary to concurrent medical problems, such as renal insufficiency and metastatic prostate cancer. We will continue to monitor. If anemia worsens, we will recheck iron profile to ensure safe iron stores are maintained. 4. History of pancytopenia - currently having only anemia. WBC, ANC, and platelet count are all in the normal range. Previous bone marrow biopsy study in January 2011 had shown no myelodysplasia, cytogenics and MDS FISH panel were also negative. Vitamin levels appear to be normal. Continue to monitor. 5. Hypocalcemia- has improved on addition of vitamin D to calcium supplementation. Discussed with necessity to continue current treatment, since Delton See is associated with hypocalcemia.   In between visits, patient advised to call or come to ER in case of any new progressive anemia symptoms or acute sickness. He is agreeable to this plan.       Roxana Hires, MD    07/08/2015 11:29 AM    Addendum 07/09/2015: PSA has decreased significantly, testosterone is within castrate range. We'll continue with current management. Roxana Hires, MD 07/09/2015 1:56 PM

## 2015-07-09 LAB — TESTOSTERONE: TESTOSTERONE: 7 ng/dL — AB (ref 348–1197)

## 2015-08-05 ENCOUNTER — Inpatient Hospital Stay: Payer: Medicare Other | Attending: Internal Medicine

## 2015-08-05 ENCOUNTER — Inpatient Hospital Stay: Payer: Medicare Other

## 2015-08-05 DIAGNOSIS — C7951 Secondary malignant neoplasm of bone: Principal | ICD-10-CM

## 2015-08-05 DIAGNOSIS — Z79899 Other long term (current) drug therapy: Secondary | ICD-10-CM | POA: Insufficient documentation

## 2015-08-05 DIAGNOSIS — Z79818 Long term (current) use of other agents affecting estrogen receptors and estrogen levels: Secondary | ICD-10-CM | POA: Diagnosis not present

## 2015-08-05 DIAGNOSIS — C61 Malignant neoplasm of prostate: Secondary | ICD-10-CM | POA: Insufficient documentation

## 2015-08-05 LAB — BASIC METABOLIC PANEL
ANION GAP: 8 (ref 5–15)
BUN: 26 mg/dL — ABNORMAL HIGH (ref 6–20)
CALCIUM: 8.1 mg/dL — AB (ref 8.9–10.3)
CO2: 21 mmol/L — AB (ref 22–32)
Chloride: 107 mmol/L (ref 101–111)
Creatinine, Ser: 0.98 mg/dL (ref 0.61–1.24)
Glucose, Bld: 182 mg/dL — ABNORMAL HIGH (ref 65–99)
Potassium: 4.9 mmol/L (ref 3.5–5.1)
Sodium: 136 mmol/L (ref 135–145)

## 2015-08-05 LAB — CBC WITH DIFFERENTIAL/PLATELET
BASOS ABS: 0 10*3/uL (ref 0–0.1)
BASOS PCT: 1 %
EOS PCT: 3 %
Eosinophils Absolute: 0.2 10*3/uL (ref 0–0.7)
HEMATOCRIT: 30 % — AB (ref 40.0–52.0)
Hemoglobin: 9.6 g/dL — ABNORMAL LOW (ref 13.0–18.0)
Lymphocytes Relative: 15 %
Lymphs Abs: 0.7 10*3/uL — ABNORMAL LOW (ref 1.0–3.6)
MCH: 25.9 pg — ABNORMAL LOW (ref 26.0–34.0)
MCHC: 31.8 g/dL — AB (ref 32.0–36.0)
MCV: 81.4 fL (ref 80.0–100.0)
MONO ABS: 0.3 10*3/uL (ref 0.2–1.0)
MONOS PCT: 7 %
NEUTROS ABS: 3.7 10*3/uL (ref 1.4–6.5)
Neutrophils Relative %: 74 %
PLATELETS: 256 10*3/uL (ref 150–440)
RBC: 3.69 MIL/uL — ABNORMAL LOW (ref 4.40–5.90)
RDW: 20 % — AB (ref 11.5–14.5)
WBC: 4.9 10*3/uL (ref 3.8–10.6)

## 2015-08-05 MED ORDER — DEGARELIX ACETATE 80 MG ~~LOC~~ SOLR
80.0000 mg | Freq: Once | SUBCUTANEOUS | Status: AC
  Administered 2015-08-05: 80 mg via SUBCUTANEOUS
  Filled 2015-08-05: qty 4

## 2015-08-05 MED ORDER — DENOSUMAB 120 MG/1.7ML ~~LOC~~ SOLN
120.0000 mg | Freq: Once | SUBCUTANEOUS | Status: AC
  Administered 2015-08-05: 120 mg via SUBCUTANEOUS
  Filled 2015-08-05: qty 1.7

## 2015-08-05 MED ORDER — DEGARELIX ACETATE 80 MG ~~LOC~~ SOLR: 80.0000 mg | Freq: Once | SUBCUTANEOUS | Status: DC

## 2015-09-02 ENCOUNTER — Inpatient Hospital Stay: Payer: Medicare Other

## 2015-09-05 ENCOUNTER — Ambulatory Visit
Admission: RE | Admit: 2015-09-05 | Discharge: 2015-09-05 | Disposition: A | Discharge status: Home / Self Care | Payer: Medicare Other | Source: Ambulatory Visit | Attending: Internal Medicine | Admitting: Internal Medicine

## 2015-09-05 ENCOUNTER — Other Ambulatory Visit: Payer: Self-pay | Admitting: *Deleted

## 2015-09-05 ENCOUNTER — Telehealth: Payer: Self-pay | Admitting: *Deleted

## 2015-09-05 ENCOUNTER — Inpatient Hospital Stay: Payer: Medicare Other

## 2015-09-05 ENCOUNTER — Inpatient Hospital Stay: Payer: Medicare Other | Attending: Internal Medicine

## 2015-09-05 VITALS — BP 159/69 | HR 62 | Temp 97.0°F | Resp 20

## 2015-09-05 DIAGNOSIS — C61 Malignant neoplasm of prostate: Secondary | ICD-10-CM | POA: Diagnosis present

## 2015-09-05 DIAGNOSIS — Z79818 Long term (current) use of other agents affecting estrogen receptors and estrogen levels: Secondary | ICD-10-CM | POA: Diagnosis not present

## 2015-09-05 DIAGNOSIS — C7951 Secondary malignant neoplasm of bone: Principal | ICD-10-CM

## 2015-09-05 DIAGNOSIS — Z9079 Acquired absence of other genital organ(s): Secondary | ICD-10-CM | POA: Diagnosis not present

## 2015-09-05 DIAGNOSIS — M79604 Pain in right leg: Secondary | ICD-10-CM

## 2015-09-05 LAB — CBC WITH DIFFERENTIAL/PLATELET
BASOS ABS: 0 10*3/uL (ref 0–0.1)
Basophils Relative: 1 %
EOS PCT: 2 %
Eosinophils Absolute: 0.1 10*3/uL (ref 0–0.7)
HEMATOCRIT: 27.2 % — AB (ref 40.0–52.0)
HEMOGLOBIN: 8.7 g/dL — AB (ref 13.0–18.0)
LYMPHS ABS: 0.5 10*3/uL — AB (ref 1.0–3.6)
LYMPHS PCT: 11 %
MCH: 25.6 pg — AB (ref 26.0–34.0)
MCHC: 31.8 g/dL — ABNORMAL LOW (ref 32.0–36.0)
MCV: 80.4 fL (ref 80.0–100.0)
Monocytes Absolute: 0.5 10*3/uL (ref 0.2–1.0)
Monocytes Relative: 9 %
NEUTROS ABS: 3.9 10*3/uL (ref 1.4–6.5)
NEUTROS PCT: 77 %
Platelets: 167 10*3/uL (ref 150–440)
RBC: 3.38 MIL/uL — AB (ref 4.40–5.90)
RDW: 17.5 % — ABNORMAL HIGH (ref 11.5–14.5)
WBC: 5 10*3/uL (ref 3.8–10.6)

## 2015-09-05 LAB — BASIC METABOLIC PANEL
ANION GAP: 4 — AB (ref 5–15)
BUN: 37 mg/dL — AB (ref 6–20)
CHLORIDE: 109 mmol/L (ref 101–111)
CO2: 19 mmol/L — AB (ref 22–32)
Calcium: 7.6 mg/dL — ABNORMAL LOW (ref 8.9–10.3)
Creatinine, Ser: 1.33 mg/dL — ABNORMAL HIGH (ref 0.61–1.24)
GFR calc Af Amer: 55 mL/min — ABNORMAL LOW (ref >60)
GFR calc non Af Amer: 48 mL/min — ABNORMAL LOW (ref >60)
GLUCOSE: 325 mg/dL — AB (ref 65–99)
POTASSIUM: 5.1 mmol/L (ref 3.5–5.1)
Sodium: 132 mmol/L — ABNORMAL LOW (ref 135–145)

## 2015-09-05 MED ORDER — DENOSUMAB 120 MG/1.7ML ~~LOC~~ SOLN
120.0000 mg | Freq: Once | SUBCUTANEOUS | Status: AC
  Administered 2015-09-05: 120 mg via SUBCUTANEOUS
  Filled 2015-09-05: qty 1.7

## 2015-09-05 MED ORDER — DEGARELIX ACETATE 80 MG ~~LOC~~ SOLR
80.0000 mg | Freq: Once | SUBCUTANEOUS | Status: AC
  Administered 2015-09-05: 80 mg via SUBCUTANEOUS
  Filled 2015-09-05: qty 4

## 2015-09-05 NOTE — Telephone Encounter (Signed)
Pt's daughter told infusion that he has had right lowe leg including knee pain for several weeks.  Was taking naproxen but was told to stop because it is not good to be on it for a long time.  They wanted something for pain. And after speaking to dr Rudean Hitt he said to do plain xray on it and daughter agrees.  Medication called into pharmacy

## 2015-09-30 ENCOUNTER — Inpatient Hospital Stay: Payer: Medicare Other

## 2015-09-30 ENCOUNTER — Telehealth: Payer: Self-pay | Admitting: *Deleted

## 2015-09-30 ENCOUNTER — Inpatient Hospital Stay (HOSPITAL_BASED_OUTPATIENT_CLINIC_OR_DEPARTMENT_OTHER): Payer: Medicare Other | Admitting: Internal Medicine

## 2015-09-30 ENCOUNTER — Other Ambulatory Visit: Payer: Self-pay | Admitting: *Deleted

## 2015-09-30 ENCOUNTER — Inpatient Hospital Stay: Payer: Medicare Other | Attending: Internal Medicine

## 2015-09-30 ENCOUNTER — Encounter: Payer: Self-pay | Admitting: Internal Medicine

## 2015-09-30 VITALS — BP 129/68 | HR 63 | Temp 97.4°F | Resp 18 | Ht 70.0 in | Wt 148.1 lb

## 2015-09-30 DIAGNOSIS — Z79818 Long term (current) use of other agents affecting estrogen receptors and estrogen levels: Secondary | ICD-10-CM

## 2015-09-30 DIAGNOSIS — I10 Essential (primary) hypertension: Secondary | ICD-10-CM | POA: Insufficient documentation

## 2015-09-30 DIAGNOSIS — K869 Disease of pancreas, unspecified: Secondary | ICD-10-CM

## 2015-09-30 DIAGNOSIS — C7951 Secondary malignant neoplasm of bone: Principal | ICD-10-CM

## 2015-09-30 DIAGNOSIS — C61 Malignant neoplasm of prostate: Secondary | ICD-10-CM | POA: Insufficient documentation

## 2015-09-30 DIAGNOSIS — E785 Hyperlipidemia, unspecified: Secondary | ICD-10-CM

## 2015-09-30 DIAGNOSIS — Z9079 Acquired absence of other genital organ(s): Secondary | ICD-10-CM | POA: Diagnosis not present

## 2015-09-30 DIAGNOSIS — Z79899 Other long term (current) drug therapy: Secondary | ICD-10-CM | POA: Diagnosis not present

## 2015-09-30 DIAGNOSIS — E875 Hyperkalemia: Secondary | ICD-10-CM

## 2015-09-30 DIAGNOSIS — E119 Type 2 diabetes mellitus without complications: Secondary | ICD-10-CM | POA: Insufficient documentation

## 2015-09-30 DIAGNOSIS — M79604 Pain in right leg: Secondary | ICD-10-CM | POA: Insufficient documentation

## 2015-09-30 DIAGNOSIS — Z8719 Personal history of other diseases of the digestive system: Secondary | ICD-10-CM | POA: Diagnosis not present

## 2015-09-30 DIAGNOSIS — R5383 Other fatigue: Secondary | ICD-10-CM

## 2015-09-30 DIAGNOSIS — D509 Iron deficiency anemia, unspecified: Secondary | ICD-10-CM | POA: Insufficient documentation

## 2015-09-30 DIAGNOSIS — Z87891 Personal history of nicotine dependence: Secondary | ICD-10-CM | POA: Insufficient documentation

## 2015-09-30 DIAGNOSIS — R932 Abnormal findings on diagnostic imaging of liver and biliary tract: Secondary | ICD-10-CM

## 2015-09-30 LAB — CBC WITH DIFFERENTIAL/PLATELET
BASOS ABS: 0 10*3/uL (ref 0–0.1)
BASOS PCT: 1 %
EOS PCT: 7 %
Eosinophils Absolute: 0.2 10*3/uL (ref 0–0.7)
HEMATOCRIT: 26.9 % — AB (ref 40.0–52.0)
Hemoglobin: 8.7 g/dL — ABNORMAL LOW (ref 13.0–18.0)
Lymphocytes Relative: 16 %
Lymphs Abs: 0.5 10*3/uL — ABNORMAL LOW (ref 1.0–3.6)
MCH: 25.3 pg — ABNORMAL LOW (ref 26.0–34.0)
MCHC: 32.2 g/dL (ref 32.0–36.0)
MCV: 78.6 fL — ABNORMAL LOW (ref 80.0–100.0)
MONO ABS: 0.4 10*3/uL (ref 0.2–1.0)
MONOS PCT: 12 %
NEUTROS ABS: 2.1 10*3/uL (ref 1.4–6.5)
Neutrophils Relative %: 64 %
PLATELETS: 179 10*3/uL (ref 150–440)
RBC: 3.42 MIL/uL — ABNORMAL LOW (ref 4.40–5.90)
RDW: 16.3 % — AB (ref 11.5–14.5)
WBC: 3.3 10*3/uL — ABNORMAL LOW (ref 3.8–10.6)

## 2015-09-30 LAB — COMPREHENSIVE METABOLIC PANEL
ALBUMIN: 3 g/dL — AB (ref 3.5–5.0)
ALT: 17 U/L (ref 17–63)
AST: 29 U/L (ref 15–41)
Alkaline Phosphatase: 299 U/L — ABNORMAL HIGH (ref 38–126)
Anion gap: 7 (ref 5–15)
BUN: 39 mg/dL — AB (ref 6–20)
CHLORIDE: 103 mmol/L (ref 101–111)
CO2: 21 mmol/L — AB (ref 22–32)
Calcium: 8.4 mg/dL — ABNORMAL LOW (ref 8.9–10.3)
Creatinine, Ser: 1.4 mg/dL — ABNORMAL HIGH (ref 0.61–1.24)
GFR calc Af Amer: 52 mL/min — ABNORMAL LOW (ref >60)
GFR calc non Af Amer: 45 mL/min — ABNORMAL LOW (ref >60)
GLUCOSE: 324 mg/dL — AB (ref 65–99)
POTASSIUM: 5.3 mmol/L — AB (ref 3.5–5.1)
Sodium: 131 mmol/L — ABNORMAL LOW (ref 135–145)
Total Bilirubin: 0.6 mg/dL (ref 0.3–1.2)
Total Protein: 5.6 g/dL — ABNORMAL LOW (ref 6.5–8.1)

## 2015-09-30 LAB — FERRITIN: FERRITIN: 29 ng/mL (ref 24–336)

## 2015-09-30 LAB — PSA: PSA: 381 ng/mL — AB (ref 0.00–4.00)

## 2015-09-30 MED ORDER — DEGARELIX ACETATE 80 MG ~~LOC~~ SOLR
80.0000 mg | Freq: Once | SUBCUTANEOUS | Status: AC
  Administered 2015-09-30: 80 mg via SUBCUTANEOUS
  Filled 2015-09-30: qty 4

## 2015-09-30 MED ORDER — SODIUM POLYSTYRENE SULFONATE 15 GM/60ML PO SUSP: 15.0000 g | Freq: Once | ORAL | Status: DC

## 2015-09-30 MED ORDER — SODIUM CHLORIDE 0.9 % IV SOLN
500.0000 mg | Freq: Once | INTRAVENOUS | Status: DC
  Filled 2015-09-30: qty 25

## 2015-09-30 MED ORDER — DENOSUMAB 120 MG/1.7ML ~~LOC~~ SOLN
120.0000 mg | Freq: Once | SUBCUTANEOUS | Status: AC
  Administered 2015-09-30: 120 mg via SUBCUTANEOUS
  Filled 2015-09-30: qty 1.7

## 2015-09-30 MED ORDER — SODIUM CHLORIDE 0.9 % IV SOLN: 500.0000 mg | Freq: Once | INTRAVENOUS | Status: DC

## 2015-09-30 NOTE — Telephone Encounter (Signed)
Called daughter and left her a message that pt has appt this Friday 2/ 10 at 9 am.  i had spoke to pt and daughter while they were in the clinic and told them about the recheck of potassium, I have sent a one time dose of kayexalate and he will need lab only and venofer for his iron level on this Friday.  I told her on my message that she could call me for any questions

## 2015-09-30 NOTE — Progress Notes (Signed)
[place]  [phone] [phone]     ID: Colin Harper OB: 1930-06-08  MR#: [id number]  ION#:[id number]  Patient Care Team: Colin Hector, MD as PCP - General (Internal Medicine)  CHIEF COMPLAINT/DIAGNOSIS/HPI:  1.  Metastatic stage IV Prostate cancer (diagnosis based on Prior history of prostate cancer s/p prostatectomy 1998, now PSA of >150, multiple bone mets on Bone Scan of 04/11/15, pelvic adenopathy on CT scan of 04/10/15. Per d/w Radiology, no amenable lesions to biopsy). Treatment with Delton See and Degarel[facility] [place] [facility]) started in 03/2015.  2. Iron deficiency anemia likely secondary to recurrent GI blood loss (EGD on 07/16/13 reported 4 small angiodysplasia lesions in the duodenum and had thermal therapy) - on intermittent parenteral iron therapy, also gets PRBC transfusion prn.  3. H/o Pancytopenia - mainly anemia with mild thrombocytopenia of unclear etiology.  Bone marrow study January 2011 showed no myelodysplasia, cytogenetics and MDS FISH panel negative. Vitamin B12 and folate levels are normal in 2016   Workup January 2011 -  Hb 8.5, WBC 3500, ANC 2700, platelets 138000, Ab-retic 69. Ferritin low at 8, TIBC 359, iron saturation low at 8%, serum iron low at 27. October 2007 - WBC 4100 with ANC 3,000, platelet 152,000. Hematocrit 34.5, absolute retic 56. PT, PTT, liver function tests, creatinine, TSH, SIEP, haptoglobin are unremarkable. B12, folate, HBsAg, HCV antibody all unremarkable. Iron studies normal except a low ferritin off 39. LDH 216.   HISTORY OF PRESENT ILLNESS:  [name] returns to our clinic for a follow-up visit. She has been able to maintain all the activities of daily living with minimal difficulties due to fatigue. He complains of occasional right leg pain below the knee, but he rarely has to take analgesics. He denies chest pain, shortness of breath, bleeding, nausea, vomiting, diarrhea or constipation.  REVIEW OF SYSTEMS:    ROS  As in HPI above. PS ECOG 2.   PAST MEDICAL HISTORY: Reviewed. Past Medical History  Diagnosis Date  . Hypertension   . Diabetes mellitus without complication ([place])   . Hyperlipidemia   . Renal disorder     Acute kidney disease x1 episode  . Cancer [facility])     Prostate (possibly current)  . Blood transfusion without reported diagnosis   . Anemia   November 2008 - stool occult blood positive. EGD and colonoscopy December 2008 showed chronic active gastritis H. pylori positive and Barrett's esophagus, and tubular adenoma in the ascending colon).  Sept 2013 - Colonoscopy with few small angiodysplasias in cecum and had coagulation, and 1 small sessile serrated adenoma removed from sigmoid colon.  EGD reported normal esophagus, stomach and duodenum.  PAST SURGICAL HISTORY: Reviewed. Past Surgical History  Procedure Laterality Date  . Prostatectomy    . Cardiac valve replacement      FAMILY HISTORY: Reviewed. Family History  Problem Relation Age of Onset  . Family history unknown: Yes    SOCIAL HISTORY: Reviewed. Social History  Substance Use Topics  . Smoking status: Former Research scientist (life sciences)  . Smokeless tobacco: Never Used  . Alcohol Use: No    No Known Allergies  Current Outpatient Prescriptions  Medication Sig Dispense Refill  . Blood Glucose Monitoring Suppl (ONE TOUCH ULTRA 2) W/DEVICE KIT Use as instructed.    . calcium carbonate (TUMS - DOSED IN MG ELEMENTAL CALCIUM) 500 MG chewable tablet Chew 1 tablet by mouth 3 (three) times daily.    . carvedilol (COREG) 3.125 MG tablet Take by mouth.    Marland Kitchen  glipiZIDE (GLUCOTROL XL) 10 MG 24 hr tablet Take by mouth.    Marland Kitchen glucose blood (ONE TOUCH ULTRA TEST) test strip Use 2 (two) times daily. Use as instructed.    . isosorbide mononitrate (IMDUR) 60 MG 24 hr tablet Take by mouth.    Marland Kitchen lisinopril (PRINIVIL,ZESTRIL) 20 MG tablet Take by mouth.    . psyllium (METAMUCIL) 58.6 % powder Take 1 packet by mouth daily as needed.    .  traMADol (ULTRAM) 50 MG tablet Take by mouth every 6 (six) hours as needed.    . vitamin B-12 (CYANOCOBALAMIN) 1000 MCG tablet Take 1,000 mcg by mouth daily.    . Vitamin D, Cholecalciferol, 1000 UNITS TABS Take 1 Dose by mouth 2 (two) times daily.    . sodium polystyrene (KAYEXALATE) 15 GM/60ML suspension Take 60 mLs (15 g total) by mouth once. 60 mL 0   No current facility-administered medications for this visit.    PHYSICAL EXAM: Filed Vitals:   09/30/15 1337  BP: 129/68  Pulse: 63  Temp: 97.4 F (36.3 C)  Resp: 18     Body mass index is 21.26 kg/(m^2).    ECOG FS:2 - Symptomatic, <50% confined to bed  BP 129/68 mmHg  Pulse 63  Temp(Src) 97.4 F (36.3 C) (Oral)  Resp 18  Ht _0  (1.778 m)  Wt 148 lb 2.4 oz (67.2 kg)  BMI 21.26 kg/m2  General Appearance:    Alert, cooperative, no distress, appears stated age, elderly Caucasian male, extremely hard of hearing   Head:    Normocephalic, without obvious abnormality, atraumatic, mouth-upper and lower dentures   Eyes:    PERRL, conjunctiva/corneas clear, EOM's intact, fundi    benign, both eyes       Ears:    Normal TM's and external ear canals, both ears  Nose:   Nares normal, septum midline, mucosa normal, no drainage   or sinus tenderness  Throat:   Lips, mucosa, and tongue normal; teeth and gums normal  Neck:   Supple, symmetrical, trachea midline, no adenopathy;       thyroid:  No enlargement/tenderness/nodules; no carotid   bruit or JVD  Back:     Symmetric, no curvature, ROM normal, no CVA tenderness  Lungs:     Clear to auscultation bilaterally, respirations unlabored  Chest wall:    No tenderness or deformity  Heart:    Regular rate and rhythm, S1 and S2 normal, no murmur, rub   or gallop  Abdomen:     Soft, non-tender, bowel sounds active all four quadrants,    no masses, no organomegaly  Rectal:    deferred by the patient   Extremities:   Extremities normal, atraumatic, no cyanosis or edema  Pulses:   2+ and  symmetric all extremities  Skin:   Skin color, texture, turgor normal, no rashes or lesions  Lymph nodes:   Cervical, supraclavicular, and axillary nodes normal  Neurologic:   CNII-XII intact. Normal strength, sensation and reflexes      throughout      LAB RESULTS: Recent Results (from the past 2160 hour(s))  Calcium     Status: Abnormal   Collection Time: 07/08/15  9:34 AM  Result Value Ref Range   Calcium 8.2 (L) 8.9 - 10.3 mg/dL  CBC with Differential/Platelet     Status: Abnormal   Collection Time: 07/08/15  9:38 AM  Result Value Ref Range   WBC 4.3 3.8 - 10.6 K/uL   RBC 3.69 (L) 4.40 -  5.90 MIL/uL   Hemoglobin 9.5 (L) 13.0 - 18.0 g/dL   HCT 30.4 (L) 40.0 - 52.0 %   MCV 82.3 80.0 - 100.0 fL   MCH 25.8 (L) 26.0 - 34.0 pg   MCHC 31.3 (L) 32.0 - 36.0 g/dL   RDW 21.7 (H) 11.5 - 14.5 %   Platelets 156 150 - 440 K/uL   Neutrophils Relative % 72 %   Neutro Abs 3.1 1.4 - 6.5 K/uL   Lymphocytes Relative 12 %   Lymphs Abs 0.5 (L) 1.0 - 3.6 K/uL   Monocytes Relative 10 %   Monocytes Absolute 0.4 0.2 - 1.0 K/uL   Eosinophils Relative 5 %   Eosinophils Absolute 0.2 0 - 0.7 K/uL   Basophils Relative 1 %   Basophils Absolute 0.0 0 - 0.1 K/uL  Creatinine, serum     Status: Abnormal   Collection Time: 07/08/15  9:38 AM  Result Value Ref Range   Creatinine, Ser 1.50 (H) 0.61 - 1.24 mg/dL   GFR calc non Af Amer 41 (L) >60 mL/min   GFR calc Af Amer 48 (L) >60 mL/min    Comment: (NOTE) The eGFR has been calculated using the CKD EPI equation. This calculation has not been validated in all clinical situations. eGFR's persistently <60 mL/min signify possible Chronic Kidney Disease.   Ferritin     Status: None   Collection Time: 07/08/15  9:38 AM  Result Value Ref Range   Ferritin 124 24 - 336 ng/mL  Hepatic function panel     Status: Abnormal   Collection Time: 07/08/15  9:38 AM  Result Value Ref Range   Total Protein 6.2 (L) 6.5 - 8.1 g/dL   Albumin 3.5 3.5 - 5.0 g/dL   AST 25  15 - 41 U/L   ALT 17 17 - 63 U/L   Alkaline Phosphatase 259 (H) 38 - 126 U/L   Total Bilirubin 0.8 0.3 - 1.2 mg/dL   Bilirubin, Direct 0.1 0.1 - 0.5 mg/dL   Indirect Bilirubin 0.7 0.3 - 0.9 mg/dL  Iron and TIBC     Status: Abnormal   Collection Time: 07/08/15  9:38 AM  Result Value Ref Range   Iron 39 (L) 45 - 182 ug/dL   TIBC 272 250 - 450 ug/dL   Saturation Ratios 14 (L) 17.9 - 39.5 %   UIBC 233 ug/dL  PSA     Status: Abnormal   Collection Time: 07/08/15  9:38 AM  Result Value Ref Range   PSA 174.00 (H) 0.00 - 4.00 ng/mL    Comment: (NOTE) While PSA levels of <=4.0 ng/ml are reported as reference range, some men with levels below 4.0 ng/ml can have prostate cancer and many men with PSA above 4.0 ng/ml do not have prostate cancer.  Other tests such as free PSA, age specific reference ranges, PSA velocity and PSA doubling time may be helpful especially in men less than 81 years old. Performed at [facility]   Testosterone     Status: Abnormal   Collection Time: 07/08/15  9:38 AM  Result Value Ref Range   Testosterone 7 (L) 348 - 1197 ng/dL   Comment, Testosterone Comment     Comment: (NOTE) Adult male reference interval is based on a population of lean males up to 79 years old. Performed At: [facility] [address] [place], [place] [id number] Lindon Romp MD CW:[id number]   CBC with Differential     Status: Abnormal   Collection Time:  08/05/15  1:46 PM  Result Value Ref Range   WBC 4.9 3.8 - 10.6 K/uL   RBC 3.69 (L) 4.40 - 5.90 MIL/uL   Hemoglobin 9.6 (L) 13.0 - 18.0 g/dL   HCT 30.0 (L) 40.0 - 52.0 %   MCV 81.4 80.0 - 100.0 fL   MCH 25.9 (L) 26.0 - 34.0 pg   MCHC 31.8 (L) 32.0 - 36.0 g/dL   RDW 20.0 (H) 11.5 - 14.5 %   Platelets 256 150 - 440 K/uL   Neutrophils Relative % 74 %   Neutro Abs 3.7 1.4 - 6.5 K/uL   Lymphocytes Relative 15 %   Lymphs Abs 0.7 (L) 1.0 - 3.6 K/uL   Monocytes Relative 7 %   Monocytes Absolute 0.3 0.2 - 1.0 K/uL    Eosinophils Relative 3 %   Eosinophils Absolute 0.2 0 - 0.7 K/uL   Basophils Relative 1 %   Basophils Absolute 0.0 0 - 0.1 K/uL  Basic metabolic panel     Status: Abnormal   Collection Time: 08/05/15  1:46 PM  Result Value Ref Range   Sodium 136 135 - 145 mmol/L   Potassium 4.9 3.5 - 5.1 mmol/L   Chloride 107 101 - 111 mmol/L   CO2 21 (L) 22 - 32 mmol/L   Glucose, Bld 182 (H) 65 - 99 mg/dL   BUN 26 (H) 6 - 20 mg/dL   Creatinine, Ser 0.98 0.61 - 1.24 mg/dL   Calcium 8.1 (L) 8.9 - 10.3 mg/dL   GFR calc non Af Amer >60 >60 mL/min   GFR calc Af Amer >60 >60 mL/min    Comment: (NOTE) The eGFR has been calculated using the CKD EPI equation. This calculation has not been validated in all clinical situations. eGFR's persistently <60 mL/min signify possible Chronic Kidney Disease.    Anion gap 8 5 - 15  CBC with Differential/Platelet     Status: Abnormal   Collection Time: 09/05/15 10:35 AM  Result Value Ref Range   WBC 5.0 3.8 - 10.6 K/uL   RBC 3.38 (L) 4.40 - 5.90 MIL/uL   Hemoglobin 8.7 (L) 13.0 - 18.0 g/dL   HCT 27.2 (L) 40.0 - 52.0 %   MCV 80.4 80.0 - 100.0 fL   MCH 25.6 (L) 26.0 - 34.0 pg   MCHC 31.8 (L) 32.0 - 36.0 g/dL   RDW 17.5 (H) 11.5 - 14.5 %   Platelets 167 150 - 440 K/uL   Neutrophils Relative % 77 %   Neutro Abs 3.9 1.4 - 6.5 K/uL   Lymphocytes Relative 11 %   Lymphs Abs 0.5 (L) 1.0 - 3.6 K/uL   Monocytes Relative 9 %   Monocytes Absolute 0.5 0.2 - 1.0 K/uL   Eosinophils Relative 2 %   Eosinophils Absolute 0.1 0 - 0.7 K/uL   Basophils Relative 1 %   Basophils Absolute 0.0 0 - 0.1 K/uL  Basic metabolic panel     Status: Abnormal   Collection Time: 09/05/15 10:35 AM  Result Value Ref Range   Sodium 132 (L) 135 - 145 mmol/L   Potassium 5.1 3.5 - 5.1 mmol/L   Chloride 109 101 - 111 mmol/L   CO2 19 (L) 22 - 32 mmol/L   Glucose, Bld 325 (H) 65 - 99 mg/dL   BUN 37 (H) 6 - 20 mg/dL   Creatinine, Ser 1.33 (H) 0.61 - 1.24 mg/dL   Calcium 7.6 (L) 8.9 - 10.3 mg/dL     GFR calc non Af Wyvonnia Lora  48 (L) >60 mL/min   GFR calc Af Amer 55 (L) >60 mL/min    Comment: (NOTE) The eGFR has been calculated using the CKD EPI equation. This calculation has not been validated in all clinical situations. eGFR's persistently <60 mL/min signify possible Chronic Kidney Disease.    Anion gap 4 (L) 5 - 15    Lab Results  Component Value Date   WBC 5.0 09/05/2015   NEUTROABS 3.9 09/05/2015   HGB 8.7* 09/05/2015   HCT 27.2* 09/05/2015   MCV 80.4 09/05/2015   PLT 167 09/05/2015    STUDIES: 04/10/15 - CT abdomen/pelvis.  IMPRESSION:  1. Widespread patchy confluent sclerotic osseous metastatic disease throughout the visualized skeleton, as described.  2. New mild to moderate left para-aortic and mild bilateral common iliac lymphadenopathy, likely metastatic.  3. New 1.2 cm hypodense pancreatic head lesion, with a suggestion of associated dilated main pancreatic duct in the pancreatic head. No biliary ductal dilatation. A pancreatic mass protocol MRI abdomen with and without intravenous gadolinium contrast is recommended for further evaluation by imaging criteria, as a pancreatic malignancy cannot be excluded.  4. Slight growth of a large solitary 2.3 cm bladder stone. No hydronephrosis.  5. Cholelithiasis.  04/11/15 - Bone Scan.  FINDINGS: Uptake is noted throughout the bony skeleton. Multiple areas of increased activity are identified consistent with bony metastatic disease. These are scattered throughout the rib cage, spine, pelvic bones and bilateral femurs. Involvement of the shoulder joints is noted bilaterally as well. Scattered its calvarial lesions are noted as well.  IMPRESSION: Diffuse metastatic disease as described  04/25/2015-MRI of abdomen with pancreatic protocol:IMPRESSION: 1. The clustered cystic lesion of concern in the pancreatic head region appears to be along a branch of the dorsal pancreatic duct extending to the minor papilla, as product of a partial  pancreas divisum. 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 Follow up MR imaging in 6 months time is recommended. This recommendation follows ACR consensus guidelines: Managing Incidental Findings on Abdominal CT: White Paper of the ACR Incidental Findings Committee. J Am Coll Radiol 2010;7:754-773 2. Enhancing nonspecific lesion in the dome of segment 8 of the liver, 2.6 cm in diameter. Specificity reduced by severe motion artifact. I am doubtful that this is a hemangioma and is not thought to represent a cyst. This might be a tricky biopsy given the proximity to the diaphragm. This could also be reassessed at the time of follow up pancreatic MRI. 3. Trace right pleural effusion. 4. Splenorenal shunt. 5. Enlarged left periaortic lymph node at 1.4 cm in short axis, stable. 6. Extensive osseous metastatic disease. 7. The gallbladder is severely full of gallstones, to the point where porcelain gallbladder cannot be readily excluded.   ASSESSMENT / PLAN:   1. Stage IV Prostate cancer (diagnosis based on Prior history of prostate cancer s/p prostatectomy 1998, now PSA of >150, multiple bone mets on Bone Scan of 04/11/15, pelvic adenopathy on CT scan of 04/10/15. Per d/w Radiology, no amenable lesions to biopsy)  -  he is currently on androgen deprivation therapy, started in August 2016, along with Xgeva. Testosterone level is within castrate range, PSA is declining. We will continue with Xgeva and firmagon. The bone pain below the right knee was recently evaluated with x-rays without any evidence of significant pathology. 2. 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 He has no evidence of cholestasis, so we will perform MRI prior to his next  visit. 3. Iron-deficiency anemia likely secondary to recurrent GI blood loss (EGD on 07/16/13 reported 4 small angiodysplasia lesions in the duodenum and had thermal therapy)  -  continue to monitor intermittently and consider parenteral iron therapy if he develops recurrent iron deficiency anemia. Iron profile has normalized after IV infusion of venofer. Hemoglobin, however, remains fairly low, likely secondary to concurrent medical problems, such as renal insufficiency and metastatic prostate cancer. His ferritin has declined as compared to previous visits, so we will arrange additional infusion of Venofer this week. 4. History of pancytopenia - currently having only anemia. WBC, ANC, and platelet count are all in the normal range. Previous bone marrow biopsy study in January 2011 had shown no myelodysplasia, cytogenics and MDS FISH panel were also negative. Vitamin levels appear to be normal. Continue to monitor. No additional interventions. 5. Hypocalcemia- has improved on addition of vitamin D to calcium supplementation. Discussed with necessity to continue current treatment, since Delton See is associated with hypocalcemia. Corrective calcium is within normal range today. 6. Hyperkalemia-we will administer Kayexalate today and will recheck potassium levels later this week.  He'll return to our clinic for Xgeva once months, and we will see him back for a visit and firmagon in 3 months.   In between visits, patient advised to call or come to ER in case of any new progressive anemia symptoms or acute sickness. He is agreeable to this plan.       Roxana Hires, MD   09/30/2015 1:10 PM    Addendum 07/09/2015: PSA has decreased significantly, testosterone is within castrate range. We'll continue with current management. Roxana Hires, MD 09/30/2015 1:10 PM

## 2015-09-30 NOTE — Progress Notes (Signed)
Pt states he is doing good no real complaints.  Occ. Constipation and take metamucil very seldom. The knee and leg right side pain the daughter and the pt has about determined that it is probably arthritis because it hurts when it is cold and rainy weather.

## 2015-10-01 ENCOUNTER — Other Ambulatory Visit: Payer: Self-pay | Admitting: *Deleted

## 2015-10-01 DIAGNOSIS — C61 Malignant neoplasm of prostate: Secondary | ICD-10-CM

## 2015-10-01 DIAGNOSIS — C7951 Secondary malignant neoplasm of bone: Principal | ICD-10-CM

## 2015-10-03 ENCOUNTER — Inpatient Hospital Stay: Payer: Medicare Other

## 2015-10-03 ENCOUNTER — Other Ambulatory Visit: Payer: Self-pay | Admitting: *Deleted

## 2015-10-03 VITALS — BP 132/58 | HR 62 | Temp 97.0°F | Resp 18

## 2015-10-03 DIAGNOSIS — E875 Hyperkalemia: Secondary | ICD-10-CM

## 2015-10-03 DIAGNOSIS — C61 Malignant neoplasm of prostate: Secondary | ICD-10-CM | POA: Diagnosis not present

## 2015-10-03 DIAGNOSIS — D509 Iron deficiency anemia, unspecified: Secondary | ICD-10-CM

## 2015-10-03 LAB — POTASSIUM: Potassium: 4.9 mmol/L (ref 3.5–5.1)

## 2015-10-03 MED ORDER — SODIUM CHLORIDE 0.9 % IV SOLN
500.0000 mg | INTRAVENOUS | Status: DC
  Administered 2015-10-03: 500 mg via INTRAVENOUS
  Filled 2015-10-03: qty 25

## 2015-10-17 ENCOUNTER — Inpatient Hospital Stay: Payer: Medicare Other

## 2015-10-17 VITALS — BP 137/74 | HR 70 | Temp 97.2°F | Resp 18

## 2015-10-17 DIAGNOSIS — C7951 Secondary malignant neoplasm of bone: Principal | ICD-10-CM

## 2015-10-17 DIAGNOSIS — C61 Malignant neoplasm of prostate: Secondary | ICD-10-CM

## 2015-10-17 MED ORDER — SODIUM CHLORIDE 0.9 % IV SOLN
Freq: Once | INTRAVENOUS | Status: AC
  Administered 2015-10-17: 09:00:00 via INTRAVENOUS
  Filled 2015-10-17: qty 1000

## 2015-10-17 MED ORDER — SODIUM CHLORIDE 0.9 % IV SOLN
500.0000 mg | Freq: Once | INTRAVENOUS | Status: AC
  Administered 2015-10-17: 500 mg via INTRAVENOUS
  Filled 2015-10-17: qty 25

## 2015-10-28 ENCOUNTER — Inpatient Hospital Stay: Payer: Medicare Other

## 2015-10-28 ENCOUNTER — Inpatient Hospital Stay: Payer: Medicare Other | Attending: Family Medicine

## 2015-10-28 DIAGNOSIS — C7951 Secondary malignant neoplasm of bone: Secondary | ICD-10-CM | POA: Insufficient documentation

## 2015-10-28 DIAGNOSIS — Z9079 Acquired absence of other genital organ(s): Secondary | ICD-10-CM | POA: Diagnosis not present

## 2015-10-28 DIAGNOSIS — D509 Iron deficiency anemia, unspecified: Secondary | ICD-10-CM

## 2015-10-28 DIAGNOSIS — C61 Malignant neoplasm of prostate: Secondary | ICD-10-CM

## 2015-10-28 DIAGNOSIS — Z79899 Other long term (current) drug therapy: Secondary | ICD-10-CM | POA: Insufficient documentation

## 2015-10-28 DIAGNOSIS — Z79818 Long term (current) use of other agents affecting estrogen receptors and estrogen levels: Secondary | ICD-10-CM | POA: Diagnosis not present

## 2015-10-28 LAB — COMPREHENSIVE METABOLIC PANEL
ALBUMIN: 2.7 g/dL — AB (ref 3.5–5.0)
ALT: 18 U/L (ref 17–63)
AST: 43 U/L — ABNORMAL HIGH (ref 15–41)
Alkaline Phosphatase: 333 U/L — ABNORMAL HIGH (ref 38–126)
Anion gap: 5 (ref 5–15)
BILIRUBIN TOTAL: 0.8 mg/dL (ref 0.3–1.2)
BUN: 31 mg/dL — AB (ref 6–20)
CHLORIDE: 109 mmol/L (ref 101–111)
CO2: 22 mmol/L (ref 22–32)
CREATININE: 1.25 mg/dL — AB (ref 0.61–1.24)
Calcium: 8.1 mg/dL — ABNORMAL LOW (ref 8.9–10.3)
GFR, EST AFRICAN AMERICAN: 60 mL/min — AB (ref >60)
GFR, EST NON AFRICAN AMERICAN: 51 mL/min — AB (ref >60)
Glucose, Bld: 269 mg/dL — ABNORMAL HIGH (ref 65–99)
POTASSIUM: 4.9 mmol/L (ref 3.5–5.1)
Sodium: 136 mmol/L (ref 135–145)
TOTAL PROTEIN: 5.3 g/dL — AB (ref 6.5–8.1)

## 2015-10-28 LAB — CBC WITH DIFFERENTIAL/PLATELET
Basophils Absolute: 0.1 10*3/uL (ref 0–0.1)
Basophils Relative: 2 %
Eosinophils Absolute: 0.1 10*3/uL (ref 0–0.7)
Eosinophils Relative: 3 %
HEMATOCRIT: 29.7 % — AB (ref 40.0–52.0)
HEMOGLOBIN: 9.5 g/dL — AB (ref 13.0–18.0)
LYMPHS ABS: 0.6 10*3/uL — AB (ref 1.0–3.6)
LYMPHS PCT: 18 %
MCH: 26.2 pg (ref 26.0–34.0)
MCHC: 32 g/dL (ref 32.0–36.0)
MCV: 81.8 fL (ref 80.0–100.0)
MONO ABS: 0.3 10*3/uL (ref 0.2–1.0)
MONOS PCT: 10 %
NEUTROS ABS: 2.2 10*3/uL (ref 1.4–6.5)
NEUTROS PCT: 67 %
Platelets: 197 10*3/uL (ref 150–440)
RBC: 3.63 MIL/uL — ABNORMAL LOW (ref 4.40–5.90)
RDW: 20.5 % — AB (ref 11.5–14.5)
WBC: 3.2 10*3/uL — ABNORMAL LOW (ref 3.8–10.6)

## 2015-10-28 MED ORDER — DENOSUMAB 120 MG/1.7ML ~~LOC~~ SOLN
120.0000 mg | Freq: Once | SUBCUTANEOUS | Status: AC
  Administered 2015-10-28: 120 mg via SUBCUTANEOUS
  Filled 2015-10-28: qty 1.7

## 2015-10-28 MED ORDER — DEGARELIX ACETATE 80 MG ~~LOC~~ SOLR
80.0000 mg | Freq: Once | SUBCUTANEOUS | Status: AC
Start: 2015-10-28 — End: 2015-10-28
  Administered 2015-10-28: 80 mg via SUBCUTANEOUS
  Filled 2015-10-28: qty 4

## 2015-10-29 LAB — PSA: PSA: 575 ng/mL — AB (ref 0.00–4.00)

## 2015-10-31 ENCOUNTER — Observation Stay
Admit: 2015-10-31 | Discharge: 2015-10-31 | Disposition: A | Discharge status: Home / Self Care | Payer: Medicare Other | Attending: Internal Medicine | Admitting: Internal Medicine

## 2015-10-31 ENCOUNTER — Emergency Department: Payer: Medicare Other

## 2015-10-31 ENCOUNTER — Other Ambulatory Visit: Payer: Self-pay

## 2015-10-31 ENCOUNTER — Encounter: Payer: Self-pay | Admitting: Emergency Medicine

## 2015-10-31 ENCOUNTER — Observation Stay: Payer: Medicare Other

## 2015-10-31 ENCOUNTER — Observation Stay
Admission: EM | Admit: 2015-10-31 | Discharge: 2015-11-03 | Disposition: A | Discharge status: Skilled Nursing Facility | Payer: Medicare Other | Attending: Internal Medicine | Admitting: Internal Medicine

## 2015-10-31 DIAGNOSIS — E119 Type 2 diabetes mellitus without complications: Secondary | ICD-10-CM | POA: Insufficient documentation

## 2015-10-31 DIAGNOSIS — Z952 Presence of prosthetic heart valve: Secondary | ICD-10-CM | POA: Insufficient documentation

## 2015-10-31 DIAGNOSIS — Z950 Presence of cardiac pacemaker: Secondary | ICD-10-CM | POA: Insufficient documentation

## 2015-10-31 DIAGNOSIS — Z823 Family history of stroke: Secondary | ICD-10-CM | POA: Insufficient documentation

## 2015-10-31 DIAGNOSIS — Z9079 Acquired absence of other genital organ(s): Secondary | ICD-10-CM | POA: Insufficient documentation

## 2015-10-31 DIAGNOSIS — R4182 Altered mental status, unspecified: Secondary | ICD-10-CM | POA: Insufficient documentation

## 2015-10-31 DIAGNOSIS — Z833 Family history of diabetes mellitus: Secondary | ICD-10-CM | POA: Insufficient documentation

## 2015-10-31 DIAGNOSIS — R4781 Slurred speech: Secondary | ICD-10-CM | POA: Diagnosis not present

## 2015-10-31 DIAGNOSIS — Z8249 Family history of ischemic heart disease and other diseases of the circulatory system: Secondary | ICD-10-CM | POA: Diagnosis not present

## 2015-10-31 DIAGNOSIS — E860 Dehydration: Secondary | ICD-10-CM | POA: Diagnosis not present

## 2015-10-31 DIAGNOSIS — I639 Cerebral infarction, unspecified: Secondary | ICD-10-CM | POA: Diagnosis not present

## 2015-10-31 DIAGNOSIS — R4701 Aphasia: Secondary | ICD-10-CM | POA: Insufficient documentation

## 2015-10-31 DIAGNOSIS — R079 Chest pain, unspecified: Secondary | ICD-10-CM | POA: Diagnosis not present

## 2015-10-31 DIAGNOSIS — Z7982 Long term (current) use of aspirin: Secondary | ICD-10-CM | POA: Diagnosis not present

## 2015-10-31 DIAGNOSIS — I081 Rheumatic disorders of both mitral and tricuspid valves: Secondary | ICD-10-CM | POA: Diagnosis not present

## 2015-10-31 DIAGNOSIS — R778 Other specified abnormalities of plasma proteins: Secondary | ICD-10-CM | POA: Diagnosis not present

## 2015-10-31 DIAGNOSIS — R479 Unspecified speech disturbances: Secondary | ICD-10-CM | POA: Diagnosis present

## 2015-10-31 DIAGNOSIS — E785 Hyperlipidemia, unspecified: Secondary | ICD-10-CM | POA: Diagnosis not present

## 2015-10-31 DIAGNOSIS — Z794 Long term (current) use of insulin: Secondary | ICD-10-CM | POA: Insufficient documentation

## 2015-10-31 DIAGNOSIS — Z87448 Personal history of other diseases of urinary system: Secondary | ICD-10-CM | POA: Insufficient documentation

## 2015-10-31 DIAGNOSIS — I10 Essential (primary) hypertension: Secondary | ICD-10-CM | POA: Insufficient documentation

## 2015-10-31 DIAGNOSIS — R531 Weakness: Secondary | ICD-10-CM | POA: Diagnosis present

## 2015-10-31 DIAGNOSIS — Z8546 Personal history of malignant neoplasm of prostate: Secondary | ICD-10-CM | POA: Insufficient documentation

## 2015-10-31 DIAGNOSIS — C7951 Secondary malignant neoplasm of bone: Secondary | ICD-10-CM | POA: Diagnosis not present

## 2015-10-31 DIAGNOSIS — Z79899 Other long term (current) drug therapy: Secondary | ICD-10-CM | POA: Diagnosis not present

## 2015-10-31 DIAGNOSIS — H9190 Unspecified hearing loss, unspecified ear: Secondary | ICD-10-CM | POA: Insufficient documentation

## 2015-10-31 DIAGNOSIS — Z87891 Personal history of nicotine dependence: Secondary | ICD-10-CM | POA: Diagnosis not present

## 2015-10-31 DIAGNOSIS — R7989 Other specified abnormal findings of blood chemistry: Secondary | ICD-10-CM

## 2015-10-31 DIAGNOSIS — D649 Anemia, unspecified: Secondary | ICD-10-CM | POA: Diagnosis not present

## 2015-10-31 LAB — CBC WITH DIFFERENTIAL/PLATELET
BASOS ABS: 0 10*3/uL (ref 0–0.1)
Basophils Relative: 1 %
EOS ABS: 0.1 10*3/uL (ref 0–0.7)
EOS PCT: 2 %
HCT: 30.7 % — ABNORMAL LOW (ref 40.0–52.0)
HEMOGLOBIN: 9.8 g/dL — AB (ref 13.0–18.0)
LYMPHS ABS: 0.9 10*3/uL — AB (ref 1.0–3.6)
LYMPHS PCT: 17 %
MCH: 26.2 pg (ref 26.0–34.0)
MCHC: 31.9 g/dL — ABNORMAL LOW (ref 32.0–36.0)
MCV: 82.1 fL (ref 80.0–100.0)
Monocytes Absolute: 0.6 10*3/uL (ref 0.2–1.0)
Monocytes Relative: 11 %
NEUTROS PCT: 69 %
Neutro Abs: 3.8 10*3/uL (ref 1.4–6.5)
PLATELETS: 171 10*3/uL (ref 150–440)
RBC: 3.74 MIL/uL — AB (ref 4.40–5.90)
RDW: 21.8 % — ABNORMAL HIGH (ref 11.5–14.5)
WBC: 5.6 10*3/uL (ref 3.8–10.6)

## 2015-10-31 LAB — URINALYSIS COMPLETE WITH MICROSCOPIC (ARMC ONLY)
BACTERIA UA: NONE SEEN
Bilirubin Urine: NEGATIVE
Glucose, UA: 50 mg/dL — AB
Leukocytes, UA: NEGATIVE
Nitrite: NEGATIVE
Protein, ur: 100 mg/dL — AB
Specific Gravity, Urine: 1.016 (ref 1.005–1.030)
pH: 5 (ref 5.0–8.0)

## 2015-10-31 LAB — COMPREHENSIVE METABOLIC PANEL
ALK PHOS: 318 U/L — AB (ref 38–126)
ALT: 19 U/L (ref 17–63)
AST: 49 U/L — AB (ref 15–41)
Albumin: 3 g/dL — ABNORMAL LOW (ref 3.5–5.0)
Anion gap: 8 (ref 5–15)
BUN: 30 mg/dL — AB (ref 6–20)
CALCIUM: 7.9 mg/dL — AB (ref 8.9–10.3)
CHLORIDE: 113 mmol/L — AB (ref 101–111)
CO2: 19 mmol/L — AB (ref 22–32)
CREATININE: 1.12 mg/dL (ref 0.61–1.24)
GFR calc Af Amer: >60 mL/min (ref >60)
GFR calc non Af Amer: 59 mL/min — ABNORMAL LOW (ref >60)
Glucose, Bld: 194 mg/dL — ABNORMAL HIGH (ref 65–99)
Potassium: 4.4 mmol/L (ref 3.5–5.1)
SODIUM: 140 mmol/L (ref 135–145)
Total Bilirubin: 1.1 mg/dL (ref 0.3–1.2)
Total Protein: 5.8 g/dL — ABNORMAL LOW (ref 6.5–8.1)

## 2015-10-31 LAB — TROPONIN I
Troponin I: 0.06 ng/mL — ABNORMAL HIGH (ref <0.031)
Troponin I: 0.06 ng/mL — ABNORMAL HIGH (ref <0.031)

## 2015-10-31 LAB — GLUCOSE, CAPILLARY
GLUCOSE-CAPILLARY: 212 mg/dL — AB (ref 65–99)
Glucose-Capillary: 163 mg/dL — ABNORMAL HIGH (ref 65–99)

## 2015-10-31 MED ORDER — STROKE: EARLY STAGES OF RECOVERY BOOK
Freq: Once | Status: AC
  Administered 2015-10-31: 13:00:00

## 2015-10-31 MED ORDER — ASPIRIN 325 MG PO TABS
325.0000 mg | ORAL_TABLET | Freq: Every day | ORAL | Status: DC
  Administered 2015-11-01 – 2015-11-03 (×3): 325 mg via ORAL
  Filled 2015-10-31 (×3): qty 1

## 2015-10-31 MED ORDER — ACETAMINOPHEN 325 MG PO TABS
650.0000 mg | ORAL_TABLET | Freq: Once | ORAL | Status: AC
  Administered 2015-10-31: 650 mg via ORAL

## 2015-10-31 MED ORDER — SENNOSIDES-DOCUSATE SODIUM 8.6-50 MG PO TABS: 1.0000 | ORAL_TABLET | Freq: Every evening | ORAL | Status: DC | PRN

## 2015-10-31 MED ORDER — INSULIN ASPART 100 UNIT/ML ~~LOC~~ SOLN
0.0000 [IU] | Freq: Three times a day (TID) | SUBCUTANEOUS | Status: DC
  Administered 2015-10-31: 2 [IU] via SUBCUTANEOUS
  Administered 2015-11-01 (×2): 5 [IU] via SUBCUTANEOUS
  Filled 2015-10-31 (×2): qty 2
  Filled 2015-10-31 (×2): qty 5
  Filled 2015-10-31: qty 2

## 2015-10-31 MED ORDER — ASPIRIN EC 325 MG PO TBEC
325.0000 mg | DELAYED_RELEASE_TABLET | Freq: Once | ORAL | Status: AC
  Administered 2015-10-31: 325 mg via ORAL
  Filled 2015-10-31: qty 1

## 2015-10-31 MED ORDER — LISINOPRIL 20 MG PO TABS
20.0000 mg | ORAL_TABLET | Freq: Every day | ORAL | Status: DC
  Administered 2015-10-31 – 2015-11-01 (×2): 20 mg via ORAL
  Filled 2015-10-31 (×2): qty 1

## 2015-10-31 MED ORDER — SODIUM CHLORIDE 0.9 % IV SOLN
1000.0000 mL | Freq: Once | INTRAVENOUS | Status: AC
  Administered 2015-10-31: 1000 mL via INTRAVENOUS

## 2015-10-31 MED ORDER — CARVEDILOL 6.25 MG PO TABS
3.1250 mg | ORAL_TABLET | Freq: Two times a day (BID) | ORAL | Status: DC
  Administered 2015-10-31 – 2015-11-03 (×6): 3.125 mg via ORAL
  Filled 2015-10-31 (×4): qty 1
  Filled 2015-10-31: qty 2
  Filled 2015-10-31: qty 1

## 2015-10-31 MED ORDER — SODIUM CHLORIDE 0.9 % IV SOLN
INTRAVENOUS | Status: AC
  Administered 2015-10-31: 21:00:00 via INTRAVENOUS

## 2015-10-31 MED ORDER — INSULIN ASPART 100 UNIT/ML ~~LOC~~ SOLN
0.0000 [IU] | Freq: Every day | SUBCUTANEOUS | Status: DC
Start: 2015-10-31 — End: 2015-11-02
  Administered 2015-10-31 – 2015-11-01 (×2): 2 [IU] via SUBCUTANEOUS

## 2015-10-31 MED ORDER — ATORVASTATIN CALCIUM 20 MG PO TABS
40.0000 mg | ORAL_TABLET | Freq: Every day | ORAL | Status: DC
  Administered 2015-10-31 – 2015-11-02 (×3): 40 mg via ORAL
  Filled 2015-10-31 (×3): qty 2

## 2015-10-31 MED ORDER — TRAMADOL HCL 50 MG PO TABS
50.0000 mg | ORAL_TABLET | Freq: Four times a day (QID) | ORAL | Status: DC | PRN
  Administered 2015-10-31 – 2015-11-01 (×2): 50 mg via ORAL
  Filled 2015-10-31 (×2): qty 1

## 2015-10-31 MED ORDER — ENOXAPARIN SODIUM 40 MG/0.4ML ~~LOC~~ SOLN
40.0000 mg | SUBCUTANEOUS | Status: DC
  Administered 2015-10-31 – 2015-11-02 (×3): 40 mg via SUBCUTANEOUS
  Filled 2015-10-31 (×4): qty 0.4

## 2015-10-31 MED ORDER — ISOSORBIDE MONONITRATE ER 60 MG PO TB24
30.0000 mg | ORAL_TABLET | Freq: Every day | ORAL | Status: DC
  Administered 2015-10-31 – 2015-11-03 (×4): 30 mg via ORAL
  Filled 2015-10-31 (×4): qty 1

## 2015-10-31 MED ORDER — ACETAMINOPHEN 325 MG PO TABS
ORAL_TABLET | ORAL | Status: AC
  Filled 2015-10-31: qty 2

## 2015-10-31 NOTE — Plan of Care (Signed)
Problem: Nutrition: Goal: Dietary intake will improve Outcome: Progressing Pt with no noted complaints, no distress or discomfort noted

## 2015-10-31 NOTE — Care Management Obs Status (Signed)
[place] NOTIFICATION   Patient Details  Name: Tristain Gottshall MRN: [id number] Date of Birth: Oct 10, 1929   Medicare Observation Status Notification Given:  Yes    Beau Fanny, RN 10/31/2015, 11:40 AM

## 2015-10-31 NOTE — ED Notes (Signed)
Daughter reports slurred speech x 3 days and this am noticed he was having difficulty getting his words out.

## 2015-10-31 NOTE — Progress Notes (Signed)
*  PRELIMINARY RESULTS* Echocardiogram 2D Echocardiogram has been performed.  Colin Harper 10/31/2015, 7:54 PM

## 2015-10-31 NOTE — Consult Note (Signed)
Referring Physician: Bridgett Larsson    Chief Complaint: Difficulty with speech  HPI: Colin Harper is an 82 y.o. male who is unable to provide history.  No family present therefore all history obtained from the chart.  The patient was dehydrated due to poor oral intake weeks ago. His daughter made him drink a lot of water and dehydration improved. However patient has had difficulty speaking, poor overall intake and generalized weakness for the past 3 days.  Patient brought in by daughter for evaluation.  Date last known well: 10/27/2015 Time last known well: Unable to determine tPA Given: No: Unable to determine LKW  Past Medical History  Diagnosis Date  . Hypertension   . Diabetes mellitus without complication ([place])   . Hyperlipidemia   . Renal disorder     Acute kidney disease x1 episode  . Cancer [facility])     Prostate (possibly current)  . Blood transfusion without reported diagnosis   . Anemia   . Pacemaker     Past Surgical History  Procedure Laterality Date  . Prostatectomy    . Cardiac valve replacement      Family History  Problem Relation Age of Onset  . Diabetes Mother   . Hypertension Mother   . Stroke Sister   . CAD Brother   . Heart attack Brother    Social History:  reports that he has quit smoking. He has never used smokeless tobacco. He reports that he does not drink alcohol or use illicit drugs.  Allergies: No Known Allergies  Medications:  I have reviewed the patient's current medications. Prior to Admission:  Prescriptions prior to admission  Medication Sig Dispense Refill Last Dose  . traMADol (ULTRAM) 50 MG tablet Take 50 mg by mouth every 6 (six) hours as needed.    prn at prn  . calcium carbonate (TUMS - DOSED IN MG ELEMENTAL CALCIUM) 500 MG chewable tablet Chew 1 tablet by mouth 3 (three) times daily.   Taking  . carvedilol (COREG) 3.125 MG tablet Take 3.125 mg by mouth 2 (two) times daily with a meal.    Taking  . glipiZIDE (GLUCOTROL XL) 10 MG 24 hr  tablet Take 10 mg by mouth daily.    Taking  . isosorbide mononitrate (IMDUR) 60 MG 24 hr tablet Take 30 mg by mouth daily.    Taking  . lisinopril (PRINIVIL,ZESTRIL) 20 MG tablet Take 20 mg by mouth daily.    Taking  . psyllium (METAMUCIL) 58.6 % powder Take 1 packet by mouth daily as needed.     . vitamin B-12 (CYANOCOBALAMIN) 1000 MCG tablet Take 1,000 mcg by mouth daily.   Taking  . Vitamin D, Cholecalciferol, 1000 UNITS TABS Take 1 tablet by mouth 2 (two) times daily.    Taking   Scheduled: . [START ON 11/01/2015] aspirin  325 mg Oral Daily  . atorvastatin  40 mg Oral q1800  . carvedilol  3.125 mg Oral BID WC  . enoxaparin (LOVENOX) injection  40 mg Subcutaneous Q24H  . insulin aspart  0-5 Units Subcutaneous QHS  . insulin aspart  0-9 Units Subcutaneous TID WC  . isosorbide mononitrate  30 mg Oral Daily  . lisinopril  20 mg Oral Daily    ROS: Unable to obtain due to speech deficits  Physical Examination: Blood pressure 158/90, pulse 73, temperature 97.7 F (36.5 C), temperature source Oral, resp. rate 20, height 5\' 10"  (1.778 m), weight 66.679 kg (147 lb), SpO2 100 %.  HEENT-  Normocephalic, no lesions,  without obvious abnormality.  Normal external eye and conjunctiva.  Normal TM's bilaterally.  Normal auditory canals and external ears. Normal external nose, mucus membranes and septum.  Normal pharynx. Cardiovascular- S1, S2 normal, pulses palpable throughout   Lungs- chest clear, no wheezing, rales, normal symmetric air entry Abdomen- soft, non-tender; bowel sounds normal; no masses,  no organomegaly Extremities- no edema Lymph-no adenopathy palpable Musculoskeletal-no joint tenderness, deformity or swelling Skin-warm and dry, no hyperpigmentation, vitiligo, or suspicious lesions  Neurological Examination Mental Status: Alert.  When asked direct questions speech is dysarthric and unintelligible with words produced being nonsensible.  Some spontaneous speech recognizable such  as "thank-you".  Unable to follow commands unless nonverbal cues given. Cranial Nerves: II: Discs flat bilaterally; Pupils equal, round, reactive to light and accommodation III,IV, VI: ptosis not present, extra-ocular motions intact bilaterally V,VII: mild right facial droop, responds to light noxious stimuli on both sides of the face VIII: hearing normal bilaterally IX,X: gag reflex present XI: bilateral shoulder shrug XII: unable to perform Motor: Moves both upper extremities strongly, spontaneously. Unable to cooperate for formal testing.  LLE is lifted against gravity strongly.  More difficulty noted on lifting the RLE, requires assistance to get off the bed.  Sensory: Responds to noxious stimuli throughout Deep Tendon Reflexes: 2+ in the upper extremities and absent in the lower extremities Plantars: Right: downgoing   Left: downgoing Cerebellar: Unable to perform secondary to ability to follow commands Gait: not tested due to safety concerns   Laboratory Studies:  Basic Metabolic Panel:  Recent Labs Lab 10/28/15 1418 10/31/15 0912  NA 136 140  K 4.9 4.4  CL 109 113*  CO2 22 19*  GLUCOSE 269* 194*  BUN 31* 30*  CREATININE 1.25* 1.12  CALCIUM 8.1* 7.9*    Liver Function Tests:  Recent Labs Lab 10/28/15 1418 10/31/15 0912  AST 43* 49*  ALT 18 19  ALKPHOS 333* 318*  BILITOT 0.8 1.1  PROT 5.3* 5.8*  ALBUMIN 2.7* 3.0*   No results for input(s): LIPASE, AMYLASE in the last 168 hours. No results for input(s): AMMONIA in the last 168 hours.  CBC:  Recent Labs Lab 10/28/15 1418 10/31/15 0912  WBC 3.2* 5.6  NEUTROABS 2.2 3.8  HGB 9.5* 9.8*  HCT 29.7* 30.7*  MCV 81.8 82.1  PLT 197 171    Cardiac Enzymes:  Recent Labs Lab 10/31/15 0912  TROPONINI 0.06*    BNP: Invalid input(s): POCBNP  CBG: No results for input(s): GLUCAP in the last 168 hours.  Microbiology: Results for orders placed or performed in visit on 02/01/13  Clostridium Difficile  by PCR     Status: None   Collection Time: 02/01/13 12:11 PM  Result Value Ref Range Status   Micro Text Report   Final       COMMENT                   NEGATIVE-CLOS.DIFFICILE TOXIN NOT DETECTED BY PCR   ANTIBIOTIC                                                      Stool culture     Status: None   Collection Time: 02/01/13 12:11 PM  Result Value Ref Range Status   Micro Text Report   Final       COMMENT  NO SALMONELLA OR SHIGELLA ISOLATED IN 48 HOURS   COMMENT                   NO CAMPYLOBACTER ANTIGEN DETECTED   COMMENT                   NO PATHOGENIC E.COLI DETECTED   ANTIBIOTIC                                                      Urine culture     Status: None   Collection Time: 02/01/13 12:11 PM  Result Value Ref Range Status   Micro Text Report   Final       SOURCE: CLEAN CATCH    ORGANISM 1                30,000 CFU/ML ENTEROCOCCUS FAECALIS   ORGANISM 2                2000 CFU COAGULASE NEGATIVE STAPHYLOCOCCUS   COMMENT                   -   ANTIBIOTIC                    ORG#1     AMPICILLIN                    S         CIPROFLOXACIN                 S         LEVOFLOXACIN                  S         LINEZOLID                     S         NITROFURANTOIN                S         TETRACYCLINE                  S             Coagulation Studies: No results for input(s): LABPROT, INR in the last 72 hours.  Urinalysis:  Recent Labs Lab 10/31/15 0912  COLORURINE YELLOW*  LABSPEC 1.016  PHURINE 5.0  GLUCOSEU 50*  HGBUR 1+*  BILIRUBINUR NEGATIVE  KETONESUR TRACE*  PROTEINUR 100*  NITRITE NEGATIVE  LEUKOCYTESUR NEGATIVE    Lipid Panel: No results found for: CHOL, TRIG, HDL, CHOLHDL, VLDL, LDLCALC  HgbA1C:  Lab Results  Component Value Date   HGBA1C 6.9* 02/01/2013    Urine Drug Screen:  No results found for: LABOPIA, COCAINSCRNUR, LABBENZ, AMPHETMU, THCU, LABBARB  Alcohol Level: No results for input(s): ETH in the last 168  hours.  Other results: EKG: sinus rhythm at 71 bpm.  Imaging: Dg Chest 1 View  10/31/2015  CLINICAL DATA:  Slurred speech for 3 days, history of prostate cancer EXAM: CHEST 1 VIEW COMPARISON:  04/11/2015, 06/08/2011 FINDINGS: Numerous sclerotic lesions throughout the thoracic skeleton consistent with prostate cancer metastasis as seen on prior bone scan. Mild cardiac enlargement stable. Vascular pattern normal. Lungs clear. There is a 2 lead cardiac  pacer with generator over the left thorax. There is no pneumothorax. There is an aortic valve replacement. IMPRESSION: No active disease.  Known sclerotic metastasis. Electronically Signed   By: Skipper Cliche M.D.   On: 10/31/2015 09:46   Ct Head Wo Contrast  10/31/2015  CLINICAL DATA:  Slurred speech for 3 days. Altered mental status. Personal history of metastatic prostate cancer. EXAM: CT HEAD WITHOUT CONTRAST TECHNIQUE: Contiguous axial images were obtained from the base of the skull through the vertex without intravenous contrast. COMPARISON:  None. FINDINGS: No acute infarct, hemorrhage, or mass lesion is present. Scattered periventricular and subcortical white matter disease is present bilaterally. The basal ganglia are intact. Insular ribbon is normal. Ventricles are proportionate to the degree of atrophy. No significant extra-axial fluid collection is present. Calvarium is intact. The paranasal sinuses and mastoid air cells are clear. Atherosclerotic calcifications are noted. ASPECTS score = 10/10 [place] Stroke Program Early CT Score Normal score = 10 IMPRESSION: 1. No acute intracranial abnormality. 2. Mild diffuse white matter disease. This likely reflects the sequela of chronic microvascular ischemia. 3. Atherosclerosis Electronically Signed   By: San Morelle M.D.   On: 10/31/2015 09:40    Assessment: 82 y.o. male presenting with aphasia and right lower extremity weakness.  Head CT personally reviewed and shows no acute changes.   Suspect acute ischemic disease in the dominant hemisphere.  Patient on no antiplatelet therapy at home due to past history of GIB.  Further work up recommended.    Stroke Risk Factors - diabetes mellitus, hyperlipidemia and hypertension  Plan: 1. HgbA1c, fasting lipid panel pending 2. MRI unable to be performed due to pacemaker.  Would repeat head CT in 48 hours 3. PT consult, OT consult, Speech consult 4. Echocardiogram 5. Carotid dopplers 6. Prophylactic therapy-Antiplatelet med: Aspirin daily 7. NPO until RN stroke swallow screen 8. Telemetry monitoring 9. Frequent neuro checks   Alexis Goodell, MD Neurology [phone] 10/31/2015, 2:35 PM

## 2015-10-31 NOTE — ED Provider Notes (Signed)
[facility] Emergency Department Provider Note     Time seen: ----------------------------------------- 9:15 AM on 10/31/2015 -----------------------------------------    I have reviewed the triage vital signs and the nursing notes.   HISTORY  Chief Complaint Aphasia    HPI Colin Harper is a 82 y.o. male who presents being brought by his daughter for slurred speech for the last 3 days. Daughter noticed this morning he was having difficulty getting his words out, she has noticed weakness and speech disturbance for about 3 days. Also this morning he could not figure out to turn off his heater in his room. Patient denies any pain or any complaints this time, daughter reports they have recently doubled his blood pressure medicine.   Past Medical History  Diagnosis Date  . Hypertension   . Diabetes mellitus without complication ([place])   . Hyperlipidemia   . Renal disorder     Acute kidney disease x1 episode  . Cancer [facility])     Prostate (possibly current)  . Blood transfusion without reported diagnosis   . Anemia   . Pacemaker     Patient Active Problem List   Diagnosis Date Noted  . Prostate cancer metastatic to bone [facility]) 04/15/2015    Past Surgical History  Procedure Laterality Date  . Prostatectomy    . Cardiac valve replacement      Allergies Review of patient's allergies indicates no known allergies.  Social History Social History  Substance Use Topics  . Smoking status: Former Research scientist (life sciences)  . Smokeless tobacco: Never Used  . Alcohol Use: No    Review of Systems Constitutional: Negative for fever. Eyes: Negative for visual changes. ENT: Negative for sore throat. Cardiovascular: Negative for chest pain. Respiratory: Negative for shortness of breath. Gastrointestinal: Negative for abdominal pain, vomiting and diarrhea. Genitourinary: Negative for dysuria. Musculoskeletal: Negative for back pain. Skin: Negative for  rash. Neurological: Positive for weakness, speech disturbance  10-point ROS otherwise negative.  ____________________________________________   PHYSICAL EXAM:  VITAL SIGNS: ED Triage Vitals  Enc Vitals Group     BP 10/31/15 0908 177/68 mmHg     Pulse Rate 10/31/15 0908 70     Resp 10/31/15 0908 16     Temp 10/31/15 0908 97.7 F (36.5 C)     Temp Source 10/31/15 0908 Oral     SpO2 10/31/15 0908 100 %     Weight 10/31/15 0908 147 lb (66.679 kg)     Height 10/31/15 0908 5\' 10"  (1.778 m)     Head Cir --      Peak Flow --      Pain Score 10/31/15 0900 0     Pain Loc --      Pain Edu? --      Excl. in [place]? --     Constitutional: Patient appears lethargic, but in no distress. Eyes: Conjunctivae are normal. PERRL. Normal extraocular movements. ENT   Head: Normocephalic and atraumatic.   Nose: No congestion/rhinnorhea.   Mouth/Throat: Mucous membranes are moist.   Neck: No stridor. Cardiovascular: Normal rate, regular rhythm. Normal and symmetric distal pulses are present in all extremities. No murmurs, rubs, or gallops. Respiratory: Normal respiratory effort without tachypnea nor retractions. Breath sounds are clear and equal bilaterally. No wheezes/rales/rhonchi. Gastrointestinal: Soft and nontender. No distention. No abdominal bruits.  Musculoskeletal: Nontender with normal range of motion in all extremities. No joint effusions.  No lower extremity tenderness nor edema. Neurologic:  Speech is slow but clear. No gross focal neurologic deficits are  appreciated. Generalized weakness, nothing focal Skin:  Skin is warm, dry and intact. No rash noted. ____________________________________________  EKG: Interpreted by me. Normal sinus rhythm with a rate of 71 bpm, normal PR interval, wide QRS, long QT interval. Likely left bundle branch block.  ____________________________________________  ED COURSE:  Pertinent labs & imaging results that were available during my care  of the patient were reviewed by me and considered in my medical decision making (see chart for details). Patient with weakness, no identifiable speech disturbance at this time. I will obtain CT imaging and basic labs. ____________________________________________    LABS (pertinent positives/negatives)  Labs Reviewed  CBC WITH DIFFERENTIAL/PLATELET - Abnormal; Notable for the following:    RBC 3.74 (*)    Hemoglobin 9.8 (*)    HCT 30.7 (*)    MCHC 31.9 (*)    RDW 21.8 (*)    Lymphs Abs 0.9 (*)    All other components within normal limits  COMPREHENSIVE METABOLIC PANEL - Abnormal; Notable for the following:    Chloride 113 (*)    CO2 19 (*)    Glucose, Bld 194 (*)    BUN 30 (*)    Calcium 7.9 (*)    Total Protein 5.8 (*)    Albumin 3.0 (*)    AST 49 (*)    Alkaline Phosphatase 318 (*)    GFR calc non Af Amer 59 (*)    All other components within normal limits  TROPONIN I - Abnormal; Notable for the following:    Troponin I 0.06 (*)    All other components within normal limits  URINALYSIS COMPLETEWITH MICROSCOPIC ([facility] ONLY) - Abnormal; Notable for the following:    Color, Urine YELLOW (*)    APPearance CLEAR (*)    Glucose, UA 50 (*)    Ketones, ur TRACE (*)    Hgb urine dipstick 1+ (*)    Protein, ur 100 (*)    Squamous Epithelial / LPF 0-5 (*)    All other components within normal limits    RADIOLOGY Images were viewed by me  CT head, chest x-ray IMPRESSION: 1. No acute intracranial abnormality. 2. Mild diffuse white matter disease. This likely reflects the sequela of chronic microvascular ischemia. 3. Atherosclerosis IMPRESSION: No active disease. Known sclerotic metastasis.  ____________________________________________  FINAL ASSESSMENT AND PLAN  Weakness, Elevated troponin, speech disturbance  Plan: Patient with labs and imaging as dictated above. Patient with weakness, subjective speech disturbance, elevated troponin. Patient was complaining of some  chest pain while in the ER. I will recommend observation, he may need an MRI as well to assess for occult CVA.   Earleen Newport, MD   Earleen Newport, MD 10/31/15 [phone]

## 2015-10-31 NOTE — H&P (Addendum)
[facility] [place] NAME: Colin Harper    MR#:  [id number]  DATE OF BIRTH:  01-25-1930  DATE OF ADMISSION:  10/31/2015  PRIMARY CARE PHYSICIAN: Adin Hector, MD   REQUESTING/REFERRING PHYSICIAN: Earleen Newport, MD  CHIEF COMPLAINT:   Chief Complaint  Patient presents with  . Aphasia   Aphasia and generalized weakness for 3 days HISTORY OF PRESENT ILLNESS:  Colin Harper  is a 82 y.o. male with a known history of a per tension, diabetes and hyperlipidemia. Patient was sent from home to ED due to aphasia and generalized weakness for 3 days. Patient is very hard of hearing, unable to provide any information. According to patient's daughter, the patient was dehydrated due to poor oral intake weeks ago. She made him drink a lot of water and dehydration improved. However patient has had aphasia,  poor overall take and generalized weakness for the past 3 days. CAT scan of the head is negative in the ED. He was treated with aspirin in the ED. Per his daughter, the patient has GI bleeding history 2 years ago, so he is not on any aspirin.  PAST MEDICAL HISTORY:   Past Medical History  Diagnosis Date  . Hypertension   . Diabetes mellitus without complication ([place])   . Hyperlipidemia   . Renal disorder     Acute kidney disease x1 episode  . Cancer [facility])     Prostate (possibly current)  . Blood transfusion without reported diagnosis   . Anemia   . Pacemaker     PAST SURGICAL HISTORY:   Past Surgical History  Procedure Laterality Date  . Prostatectomy    . Cardiac valve replacement      SOCIAL HISTORY:   Social History  Substance Use Topics  . Smoking status: Former Research scientist (life sciences)  . Smokeless tobacco: Never Used  . Alcohol Use: No    FAMILY HISTORY:   Family History  Problem Relation Age of Onset  . Diabetes Mother   . Hypertension Mother   . Stroke Sister   . CAD Brother   . Heart attack Brother     DRUG  ALLERGIES:  No Known Allergies  REVIEW OF SYSTEMS:  Very hard of hearing, unable to get a ROS.  MEDICATIONS AT HOME:   Prior to Admission medications   Medication Sig Start Date End Date Taking? Authorizing Provider  traMADol (ULTRAM) 50 MG tablet Take 50 mg by mouth every 6 (six) hours as needed.    Yes Historical Provider, MD  calcium carbonate (TUMS - DOSED IN MG ELEMENTAL CALCIUM) 500 MG chewable tablet Chew 1 tablet by mouth 3 (three) times daily.    Historical Provider, MD  carvedilol (COREG) 3.125 MG tablet Take 3.125 mg by mouth 2 (two) times daily with a meal.  04/01/15   Historical Provider, MD  glipiZIDE (GLUCOTROL XL) 10 MG 24 hr tablet Take 10 mg by mouth daily.  04/01/15 03/31/16  Historical Provider, MD  isosorbide mononitrate (IMDUR) 60 MG 24 hr tablet Take 30 mg by mouth daily.  04/01/15   Historical Provider, MD  lisinopril (PRINIVIL,ZESTRIL) 20 MG tablet Take 20 mg by mouth daily.  04/01/15   Historical Provider, MD  psyllium (METAMUCIL) 58.6 % powder Take 1 packet by mouth daily as needed.    Historical Provider, MD  vitamin B-12 (CYANOCOBALAMIN) 1000 MCG tablet Take 1,000 mcg by mouth daily.    Historical Provider, MD  Vitamin D, Cholecalciferol, 1000  UNITS TABS Take 1 tablet by mouth 2 (two) times daily.     Historical Provider, MD      VITAL SIGNS:  Blood pressure 168/72, pulse 69, temperature 98.2 F (36.8 C), temperature source Oral, resp. rate 16, height 5\' 10"  (1.778 m), weight 66.679 kg (147 lb), SpO2 100 %.  PHYSICAL EXAMINATION:  GENERAL:  82 y.o.-year-old patient lying in the bed with no acute distress.  EYES: Pupils equal, round, reactive to light and accommodation. No scleral icterus. Extraocular muscles intact.  HEENT: Head atraumatic, normocephalic. Oropharynx and nasopharynx clear. Very hard of hearing. NECK:  Supple, no jugular venous distention. No thyroid enlargement, no tenderness.  LUNGS: Normal breath sounds bilaterally, no wheezing, rales,rhonchi or  crepitation. No use of accessory muscles of respiration.  CARDIOVASCULAR: S1, S2 normal. No murmurs, rubs, or gallops.  ABDOMEN: Soft, nontender, nondistended. Bowel sounds present. No organomegaly or mass.  EXTREMITIES: No pedal edema, cyanosis, or clubbing.  NEUROLOGIC: Cranial nerves II through XII are intact. Muscle strength 4/5 in all extremities except right leg 3/5. Sensation intact. Gait not checked.  PSYCHIATRIC: The patient is confused and very hard hearing. SKIN: No obvious rash, lesion, or ulcer.   LABORATORY PANEL:   CBC  Recent Labs Lab 10/31/15 0912  WBC 5.6  HGB 9.8*  HCT 30.7*  PLT 171   ------------------------------------------------------------------------------------------------------------------  Chemistries   Recent Labs Lab 10/31/15 0912  NA 140  K 4.4  CL 113*  CO2 19*  GLUCOSE 194*  BUN 30*  CREATININE 1.12  CALCIUM 7.9*  AST 49*  ALT 19  ALKPHOS 318*  BILITOT 1.1   ------------------------------------------------------------------------------------------------------------------  Cardiac Enzymes  Recent Labs Lab 10/31/15 0912  TROPONINI 0.06*   ------------------------------------------------------------------------------------------------------------------  RADIOLOGY:  Dg Chest 1 View  10/31/2015  CLINICAL DATA:  Slurred speech for 3 days, history of prostate cancer EXAM: CHEST 1 VIEW COMPARISON:  04/11/2015, 06/08/2011 FINDINGS: Numerous sclerotic lesions throughout the thoracic skeleton consistent with prostate cancer metastasis as seen on prior bone scan. Mild cardiac enlargement stable. Vascular pattern normal. Lungs clear. There is a 2 lead cardiac pacer with generator over the left thorax. There is no pneumothorax. There is an aortic valve replacement. IMPRESSION: No active disease.  Known sclerotic metastasis. Electronically Signed   By: Skipper Cliche M.D.   On: 10/31/2015 09:46   Ct Head Wo Contrast  10/31/2015  CLINICAL  DATA:  Slurred speech for 3 days. Altered mental status. Personal history of metastatic prostate cancer. EXAM: CT HEAD WITHOUT CONTRAST TECHNIQUE: Contiguous axial images were obtained from the base of the skull through the vertex without intravenous contrast. COMPARISON:  None. FINDINGS: No acute infarct, hemorrhage, or mass lesion is present. Scattered periventricular and subcortical white matter disease is present bilaterally. The basal ganglia are intact. Insular ribbon is normal. Ventricles are proportionate to the degree of atrophy. No significant extra-axial fluid collection is present. Calvarium is intact. The paranasal sinuses and mastoid air cells are clear. Atherosclerotic calcifications are noted. ASPECTS score = 10/10 [place] Stroke Program Early CT Score Normal score = 10 IMPRESSION: 1. No acute intracranial abnormality. 2. Mild diffuse white matter disease. This likely reflects the sequela of chronic microvascular ischemia. 3. Atherosclerosis Electronically Signed   By: San Morelle M.D.   On: 10/31/2015 09:40    EKG:   Orders placed or performed during the hospital encounter of 10/31/15  . ED EKG  . ED EKG    IMPRESSION AND PLAN:    Aphasia and weakness The patient will  be placed for observation. I will continue aspirin and Lipitor, check lipid panel, unable to get MRI of the brain due to pacemaker, so I will get neurology consult.  Check carotid duplex and echocardiogram. Neuro check, PT and speech study evaluation.  Dehydration. Start normal saline IV and follow-up BMP.  Hypertension. Continue her regular lisinopril. Diabetes. hold glipizide and start sliding scale. Anemia of chronic disease. Stable.   I discussed with Dr. Jori Moll, on-call neurologist. All the records are reviewed and case discussed with ED provider. Management plans discussed with the patient, his daughter and they are in agreement.  CODE STATUS: DO NOT RESUSCITATE per his daughter.  TOTAL TIME  TAKING CARE OF THIS PATIENT: 55 minutes.    Demetrios Loll M.D on 10/31/2015 at 11:37 AM  Between 7am to 6pm - Pager - [phone]  After 6pm go to [email or web address] - password [email or web address] [facility]  [facility]  Office  [phone]  CC: Primary care physician; Adin Hector, MD

## 2015-10-31 NOTE — Evaluation (Signed)
Clinical/Bedside Swallow Evaluation Patient Details  Name: Colin Harper MRN: [id number] Date of Birth: 1930-05-23  Today's Date: 10/31/2015 Time: SLP Start Time (ACUTE ONLY): 1600 SLP Stop Time (ACUTE ONLY): 1645 SLP Time Calculation (min) (ACUTE ONLY): 45 min  Past Medical History:  Past Medical History  Diagnosis Date  . Hypertension   . Diabetes mellitus without complication ([place])   . Hyperlipidemia   . Renal disorder     Acute kidney disease x1 episode  . Cancer [facility])     Prostate (possibly current)  . Blood transfusion without reported diagnosis   . Anemia   . Pacemaker    Past Surgical History:  Past Surgical History  Procedure Laterality Date  . Prostatectomy    . Cardiac valve replacement     HPI:      Assessment / Plan / Recommendation Clinical Impression  Pt appeared to adequately tolerate trials of thin liquids via cup and straw then purees/soft solids w/ no overt s/s of aspiration w/ all trials given. No decline in respiratory status noted; no change or wet vocal quality post trials given. Oral phase wfl w/ trials; pt cleared appropriately and timely bolus management was noted w/ all trials. Pt fed self w/ min. support sec. to UE weakness (R>L) it appeared. Pt is HOH. Pt appears at reduced risk for aspiration following general aspiration precautions. Rec. a Dsy. 3 w/ thin liquids diet; aspiration precautions; meds in Puree if any trouble swallowing w/ liquids; NO large straws; tray setup at meals. Of note, pt tolerated 1-2 pills at a time w/ water w/ NSG during evaluation.     Aspiration Risk   (reduced w/ precautions)    Diet Recommendation  Dys. 3 moistened; thin liquids; aspiration precautions; tray setup at meals.  Medication Administration: Whole meds with liquid (or in Puree if necessary)    Other  Recommendations Recommended Consults:  (Dietician) Oral Care Recommendations: Oral care BID;Staff/trained caregiver to provide oral care   Follow up  Recommendations   (TBD for any speech-language services needed)    Frequency and Duration min 2x/week  1 week (for toleration of diet)       Prognosis Prognosis for Safe Diet Advancement: Good Barriers to Reach Goals:  (TBD)      Swallow Study   General Date of Onset: 10/31/15 Type of Study: Bedside Swallow Evaluation Previous Swallow Assessment: none indicated Diet Prior to this Study: Regular;Thin liquids (unsure) Temperature Spikes Noted: No (wbc 5.6) Respiratory Status: Room air History of Recent Intubation: No Behavior/Cognition: Alert;Cooperative;Pleasant mood;Requires cueing (HOH) Oral Cavity Assessment: Dry Oral Care Completed by SLP: Recent completion by staff Oral Cavity - Dentition: Missing dentition (lower dentition; edentulous on top(doesn't wear uppers)) Vision: Functional for self-feeding Self-Feeding Abilities: Able to feed self;Needs assist;Needs set up Patient Positioning: Upright in bed Baseline Vocal Quality: Normal (mumbled speech intermittently - no upper dentition) Volitional Cough: Strong Volitional Swallow: Able to elicit    Oral/Motor/Sensory Function Overall Oral Motor/Sensory Function: Within functional limits (grossly )   Ice Chips Ice chips: Within functional limits Presentation: Spoon (fed; 2 trials)   Thin Liquid Thin Liquid: Within functional limits Presentation: Cup;Self Fed;Straw (~4 ozs total)    Nectar Thick Nectar Thick Liquid: Not tested   Honey Thick Honey Thick Liquid: Not tested   Puree Puree: Within functional limits Presentation: Spoon;Self Fed (~3-4 ozs)   Solid   GO   Solid: Within functional limits Presentation: Spoon;Self Fed (mashed solids w/ puree; 3 trials)    Functional Assessment Tool  Used: clinical judgement Functional Limitations: Swallowing Swallow Current Status [phone]): At least 1 percent but less than 20 percent impaired, limited or restricted Swallow Goal Status [phone]): At least 1 percent but less than  20 percent impaired, limited or restricted Swallow Discharge Status [phone]): At least 1 percent but less than 20 percent impaired, limited or restricted    Orinda Kenner, [place], CCC-SLP  Watson,Katherine 10/31/2015,4:46 PM

## 2015-11-01 ENCOUNTER — Observation Stay: Payer: Medicare Other

## 2015-11-01 DIAGNOSIS — I639 Cerebral infarction, unspecified: Secondary | ICD-10-CM | POA: Diagnosis not present

## 2015-11-01 LAB — LIPID PANEL
CHOLESTEROL: 181 mg/dL (ref 0–200)
HDL: 39 mg/dL — ABNORMAL LOW (ref >40)
LDL Cholesterol: 127 mg/dL — ABNORMAL HIGH (ref 0–99)
TRIGLYCERIDES: 77 mg/dL (ref <150)
Total CHOL/HDL Ratio: 4.6 RATIO
VLDL: 15 mg/dL (ref 0–40)

## 2015-11-01 LAB — BASIC METABOLIC PANEL
ANION GAP: 5 (ref 5–15)
BUN: 18 mg/dL (ref 6–20)
CHLORIDE: 114 mmol/L — AB (ref 101–111)
CO2: 22 mmol/L (ref 22–32)
Calcium: 7.3 mg/dL — ABNORMAL LOW (ref 8.9–10.3)
Creatinine, Ser: 0.86 mg/dL (ref 0.61–1.24)
GFR calc non Af Amer: >60 mL/min (ref >60)
GLUCOSE: 131 mg/dL — AB (ref 65–99)
POTASSIUM: 3.8 mmol/L (ref 3.5–5.1)
Sodium: 141 mmol/L (ref 135–145)

## 2015-11-01 LAB — GLUCOSE, CAPILLARY
GLUCOSE-CAPILLARY: 252 mg/dL — AB (ref 65–99)
GLUCOSE-CAPILLARY: 263 mg/dL — AB (ref 65–99)
GLUCOSE-CAPILLARY: 222 mg/dL — AB (ref 65–99)
Glucose-Capillary: 117 mg/dL — ABNORMAL HIGH (ref 65–99)

## 2015-11-01 LAB — HEMOGLOBIN A1C: HEMOGLOBIN A1C: 8.3 % — AB (ref 4.0–6.0)

## 2015-11-01 LAB — ECHOCARDIOGRAM COMPLETE
HEIGHTINCHES: 70 in
Weight: 2352 oz

## 2015-11-01 MED ORDER — ENSURE ENLIVE PO LIQD
237.0000 mL | Freq: Two times a day (BID) | ORAL | Status: DC
  Administered 2015-11-01 – 2015-11-03 (×5): 237 mL via ORAL

## 2015-11-01 NOTE — Progress Notes (Signed)
Physical Therapy Evaluation Patient Details Name: Colin Harper MRN: [id number] DOB: 1929/08/25 Today's Date: 11/01/2015   History of Present Illness  Patient is an 82 y.o. male who presented to ED from home after experiencing progressive aphasia and weakness over the course of 3 days. PMH includes HTN, DM, HLD, and HOH.  Clinical Impression  Patient is a pleasant 82 y.o. Male who is alert and oriented at time of evaluation. Patient reports modified independence in household ambulation with SPC/standard walker "depending on how he feels." Patient experiences difficulty describing stairs within home, but subjective obtained from daughter in previous note describes it as a split level with 14 stairs separated by a landing between garage and bedrooms. Upon evaluation, patient demonstrates normal LT sensation and muscle strength. Patient required minimal assistance to perform bed mobility and to ambulate 50'. PT recommended patient utilize walker at home to decrease risk for falls, and a RW has been recommended. Patietn will continue to benefit from progressive PT while at hospital to improve stamina for stairs to enter home. Because patient is at home alone for most of the day due to daughter's work schedule, it is recommended that he have 24 hour assistance and HHPT until he returns to his baseline level of function.     Follow Up Recommendations Home health PT;Supervision/Assistance - 24 hour    Equipment Recommendations  Rolling Walking 5 inch wheels    Recommendations for Other Services       Precautions / Restrictions Precautions Precautions: Fall Restrictions Weight Bearing Restrictions: No      Mobility  Bed Mobility Overal bed mobility: Needs Assistance Bed Mobility: Supine to Sit;Sit to Supine     Supine to sit: Min guard Sit to supine: Min guard   General bed mobility comments: Patient able to move from supine to sit with CGA. Required minimal assistance for LE from sit to  supine.  Transfers Overall transfer level: Modified independent Equipment used: Rolling walker (2 wheeled) Transfers: Sit to/from Stand Sit to Stand: Modified independent (Device/Increase time)         General transfer comment: Patient moves from sit to stand and stand to sit with good safety awareness. Unsupported static standing balance good.  Ambulation/Gait Ambulation/Gait assistance: Min guard Ambulation Distance (Feet): 50 Feet Assistive device: Rolling walker (2 wheeled)       General Gait Details: Patient ambulates at decreased cadence. Able to perform head turns with no LOB.  Stairs            Wheelchair Mobility    Modified Rankin (Stroke Patients Only)       Balance Overall balance assessment: Needs assistance Sitting-balance support: Feet supported Sitting balance-Leahy Scale: Good     Standing balance support: Bilateral upper extremity supported Standing balance-Leahy Scale: Good                               Pertinent Vitals/Pain Pain Assessment: No/denies pain    Home Living Family/patient expects to be discharged to:: Private residence Living Arrangements: Children;Other (Comment) (Daughter) Available Help at Discharge: Family;Available PRN/intermittently Type of Home: House Home Access: Stairs to enter Entrance Stairs-Rails: Can reach both Entrance Stairs-Number of Steps: 7  Home Layout: Multi-level;Bed/bath upstairs Home Equipment: Tub bench      Prior Function Level of Independence: Independent with assistive device(s)               Hand Dominance   Dominant Hand: Right  Extremity/Trunk Assessment   Upper Extremity Assessment: Overall WFL for tasks assessed           Lower Extremity Assessment: Overall WFL for tasks assessed         Communication   Communication: HOH;No difficulties  Cognition Arousal/Alertness: Awake/alert Behavior During Therapy: WFL for tasks assessed/performed Overall  Cognitive Status: Within Functional Limits for tasks assessed Area of Impairment: Memory     Memory: Decreased short-term memory Following Commands: Follows one step commands consistently;Follows multi-step commands inconsistently       General Comments: Does better with visual cues    General Comments      Exercises        Assessment/Plan    PT Assessment Patient needs continued PT services  PT Diagnosis Generalized weakness   PT Problem List Decreased activity tolerance;Decreased balance;Decreased mobility;Decreased safety awareness;Decreased cognition  PT Treatment Interventions Gait training;Patient/family education;Balance training;Functional mobility training;Stair training   PT Goals (Current goals can be found in the Care Plan section) Acute Rehab PT Goals Patient Stated Goal: "To get better" PT Goal Formulation: With patient Time For Goal Achievement: 11/15/15 Potential to Achieve Goals: Good    Frequency Min 2X/week   Barriers to discharge Decreased caregiver support      Co-evaluation               End of Session Equipment Utilized During Treatment: Gait belt Activity Tolerance: Patient tolerated treatment well Patient left: in bed;with call bell/phone within reach;with bed alarm set Nurse Communication: Mobility status    Functional Assessment Tool Used: Clinical Judgement Functional Limitation: Mobility: Walking and moving around Mobility: Walking and Moving Around Current Status [id number]): At least 20 percent but less than 40 percent impaired, limited or restricted Mobility: Walking and Moving Around Goal Status [phone]): At least 20 percent but less than 40 percent impaired, limited or restricted    Time: 1134-1158 PT Time Calculation (min) (ACUTE ONLY): 24 min   Charges:   PT Evaluation $PT Eval Low Complexity: 1 Procedure     PT G Codes:   PT G-Codes **NOT FOR INPATIENT CLASS** Functional Assessment Tool Used: Clinical  Judgement Functional Limitation: Mobility: Walking and moving around Mobility: Walking and Moving Around Current Status [id number]): At least 20 percent but less than 40 percent impaired, limited or restricted Mobility: Walking and Moving Around Goal Status [phone]): At least 20 percent but less than 40 percent impaired, limited or restricted    Dorice Lamas, PT, DPT 11/01/2015, 12:24 PM

## 2015-11-01 NOTE — Progress Notes (Signed)
Subjective: Patient improved today but still not back to baseline.  At baseline ambulated with a cane and was able to drive.    Objective: Current vital signs: BP 173/65 mmHg  Pulse 70  Temp(Src) 97.9 F (36.6 C) (Oral)  Resp 20  Ht 5\' 10"  (1.778 m)  Wt 66.679 kg (147 lb)  BMI 21.09 kg/m2  SpO2 100% Vital signs in last 24 hours: Temp:  [97.5 F (36.4 C)-98 F (36.7 C)] 97.9 F (36.6 C) (03/11 0801) Pulse Rate:  [64-73] 70 (03/11 0801) Resp:  [16-24] 20 (03/11 0801) BP: (150-176)/(63-90) 173/65 mmHg (03/11 0801) SpO2:  [100 %] 100 % (03/11 0801)  Intake/Output from previous day: 03/10 0701 - 03/11 0700 In: 240 [P.O.:240] Out: 485 [Urine:485] Intake/Output this shift:   Nutritional status: DIET DYS 3 Room service appropriate?: Yes with Assist; Fluid consistency:: Thin  Neurologic Exam: Mental Status: Alert. Speech more fluent today.  Put up two fingers when asked which patient was unable to do yesterday.   Cranial Nerves: II: Discs flat bilaterally; Pupils equal, round, reactive to light and accommodation III,IV, VI: ptosis not present, extra-ocular motions intact bilaterally V,VII: mild right facial droop, responds to light noxious stimuli on both sides of the face VIII: hard of hearing  IX,X: gag reflex present XI: bilateral shoulder shrug XII: unable to perform Motor: Moves both upper extremities strongly, spontaneously. Working with OT   Lab Results: Basic Metabolic Panel:  Recent Labs Lab 10/28/15 1418 10/31/15 0912 11/01/15 0459  NA 136 140 141  K 4.9 4.4 3.8  CL 109 113* 114*  CO2 22 19* 22  GLUCOSE 269* 194* 131*  BUN 31* 30* 18  CREATININE 1.25* 1.12 0.86  CALCIUM 8.1* 7.9* 7.3*    Liver Function Tests:  Recent Labs Lab 10/28/15 1418 10/31/15 0912  AST 43* 49*  ALT 18 19  ALKPHOS 333* 318*  BILITOT 0.8 1.1  PROT 5.3* 5.8*  ALBUMIN 2.7* 3.0*   No results for input(s): LIPASE, AMYLASE in the last 168 hours. No results for input(s):  AMMONIA in the last 168 hours.  CBC:  Recent Labs Lab 10/28/15 1418 10/31/15 0912  WBC 3.2* 5.6  NEUTROABS 2.2 3.8  HGB 9.5* 9.8*  HCT 29.7* 30.7*  MCV 81.8 82.1  PLT 197 171    Cardiac Enzymes:  Recent Labs Lab 10/31/15 0912 10/31/15 1451  TROPONINI 0.06* 0.06*    Lipid Panel:  Recent Labs Lab 11/01/15 0459  CHOL 181  TRIG 77  HDL 39*  CHOLHDL 4.6  VLDL 15  LDLCALC 127*    CBG:  Recent Labs Lab 10/31/15 1625 10/31/15 2110 11/01/15 0747  GLUCAP 163* 212* 117*    Microbiology: Results for orders placed or performed in visit on 02/01/13  Clostridium Difficile by PCR     Status: None   Collection Time: 02/01/13 12:11 PM  Result Value Ref Range Status   Micro Text Report   Final       COMMENT                   NEGATIVE-CLOS.DIFFICILE TOXIN NOT DETECTED BY PCR   ANTIBIOTIC                                                      Stool culture     Status: None  Collection Time: 02/01/13 12:11 PM  Result Value Ref Range Status   Micro Text Report   Final       COMMENT                   NO SALMONELLA OR SHIGELLA ISOLATED IN 8 HOURS   COMMENT                   NO CAMPYLOBACTER ANTIGEN DETECTED   COMMENT                   NO PATHOGENIC E.COLI DETECTED   ANTIBIOTIC                                                      Urine culture     Status: None   Collection Time: 02/01/13 12:11 PM  Result Value Ref Range Status   Micro Text Report   Final       SOURCE: CLEAN CATCH    ORGANISM 1                30,000 CFU/ML ENTEROCOCCUS FAECALIS   ORGANISM 2                2000 CFU COAGULASE NEGATIVE STAPHYLOCOCCUS   COMMENT                   -   ANTIBIOTIC                    ORG#1     AMPICILLIN                    S         CIPROFLOXACIN                 S         LEVOFLOXACIN                  S         LINEZOLID                     S         NITROFURANTOIN                S         TETRACYCLINE                  S             Coagulation Studies: No  results for input(s): LABPROT, INR in the last 72 hours.  Imaging: Dg Chest 1 View  10/31/2015  CLINICAL DATA:  Slurred speech for 3 days, history of prostate cancer EXAM: CHEST 1 VIEW COMPARISON:  04/11/2015, 06/08/2011 FINDINGS: Numerous sclerotic lesions throughout the thoracic skeleton consistent with prostate cancer metastasis as seen on prior bone scan. Mild cardiac enlargement stable. Vascular pattern normal. Lungs clear. There is a 2 lead cardiac pacer with generator over the left thorax. There is no pneumothorax. There is an aortic valve replacement. IMPRESSION: No active disease.  Known sclerotic metastasis. Electronically Signed   By: Skipper Cliche M.D.   On: 10/31/2015 09:46   Ct Head Wo Contrast  10/31/2015  CLINICAL DATA:  Slurred speech for 3 days. Altered mental status. Personal history of  metastatic prostate cancer. EXAM: CT HEAD WITHOUT CONTRAST TECHNIQUE: Contiguous axial images were obtained from the base of the skull through the vertex without intravenous contrast. COMPARISON:  None. FINDINGS: No acute infarct, hemorrhage, or mass lesion is present. Scattered periventricular and subcortical white matter disease is present bilaterally. The basal ganglia are intact. Insular ribbon is normal. Ventricles are proportionate to the degree of atrophy. No significant extra-axial fluid collection is present. Calvarium is intact. The paranasal sinuses and mastoid air cells are clear. Atherosclerotic calcifications are noted. ASPECTS score = 10/10 [place] Stroke Program Early CT Score Normal score = 10 IMPRESSION: 1. No acute intracranial abnormality. 2. Mild diffuse white matter disease. This likely reflects the sequela of chronic microvascular ischemia. 3. Atherosclerosis Electronically Signed   By: San Morelle M.D.   On: 10/31/2015 09:40   US Carotid Bilateral  10/31/2015  CLINICAL DATA:  Slurred speech. History of hyperlipidemia and diabetes. EXAM: BILATERAL CAROTID DUPLEX  ULTRASOUND TECHNIQUE: Pearline Cables scale imaging, color Doppler and duplex ultrasound were performed of bilateral carotid and vertebral arteries in the neck. COMPARISON:  None. FINDINGS: Criteria: Quantification of carotid stenosis is based on velocity parameters that correlate the residual internal carotid diameter with NASCET-based stenosis levels, using the diameter of the distal internal carotid lumen as the denominator for stenosis measurement. The following velocity measurements were obtained: RIGHT ICA:  67/12 cm/sec CCA:  [id number] cm/sec SYSTOLIC ICA/CCA RATIO:  1.1 DIASTOLIC ICA/CCA RATIO:  1.9 ECA:  94 cm/sec LEFT ICA:  83/16 cm/sec CCA:  [id number] cm/sec SYSTOLIC ICA/CCA RATIO:  1.2 DIASTOLIC ICA/CCA RATIO:  2.2 ECA:  120 cm/sec RIGHT CAROTID ARTERY: There is a minimal amount of eccentric mixed echogenic plaque within the mid aspect the right common carotid artery (image 9). There is a moderate amount of eccentric mixed echogenic plaque within the right carotid bulb (representative images 16 and 18), extending to involve the origin and proximal aspects of the right internal carotid artery (image 26), not resulting in elevated peak systolic velocities within the interrogated course the right internal carotid artery to suggest a hemodynamically significant stenosis. RIGHT VERTEBRAL ARTERY:  Antegrade flow LEFT CAROTID ARTERY: There is a minimal amount of eccentric mixed echogenic plaque within the mid aspect the left common carotid artery (image 41). There is a minimal to moderate amount of eccentric mixed echogenic plaque within the left carotid bulb (representative images 48 and 50), extending to involve the origin and proximal aspects of the left internal carotid artery (image 58), not resulting in elevated peak systolic velocities within the interrogated course of the left internal carotid artery to suggest a hemodynamically significant stenosis. LEFT VERTEBRAL ARTERY:  Antegrade flow IMPRESSION: Minimal to moderate  amount of bilateral atherosclerotic plaque, right subjectively greater than left, not resulting in a hemodynamically significant stenosis within either internal carotid artery. Electronically Signed   By: Sandi Mariscal M.D.   On: 10/31/2015 16:17    Medications:  I have reviewed the patient's current medications. Scheduled: . aspirin  325 mg Oral Daily  . atorvastatin  40 mg Oral q1800  . carvedilol  3.125 mg Oral BID WC  . enoxaparin (LOVENOX) injection  40 mg Subcutaneous Q24H  . feeding supplement (ENSURE ENLIVE)  237 mL Oral BID BM  . insulin aspart  0-5 Units Subcutaneous QHS  . insulin aspart  0-9 Units Subcutaneous TID WC  . isosorbide mononitrate  30 mg Oral Daily  . lisinopril  20 mg Oral Daily    Assessment/Plan: Patient improved but still not  at baseline.  Echocardiogram shows an EF of 40-45% with no cardiac source of emboli identified.  Carotid dopplers shows no hemodynamically significant stenosis.  Patient started on ASA.  LDL 128.  A1c pending.    Recommendations: 1.  Risk factor modification 2.  Continue ASA 3.  Continued therapy     Alexis Goodell, MD Neurology [phone] 11/01/2015  11:16 AM

## 2015-11-01 NOTE — Progress Notes (Signed)
Initial Nutrition Assessment     INTERVENTION:  Meals and snacks: Monitor intake Medical Nutrition Supplement Therapy: Recommend Ensure Enlive po BID, each supplement provides 350 kcal and 20 grams of protein    NUTRITION DIAGNOSIS:   Inadequate oral intake related to acute illness as evidenced by meal completion < 25%.    GOAL:   Patient will meet greater than or equal to 90% of their needs    MONITOR:    (Energy intake, )  REASON FOR ASSESSMENT:   Consult Poor PO  ASSESSMENT:      Pt admitted with aphasia, weakness of right lower extremity.  Noted poor po intake weeks ago per MD note and dtr encouraged pt to drink additional fluids  Past Medical History  Diagnosis Date  . Hypertension   . Diabetes mellitus without complication ([place])   . Hyperlipidemia   . Renal disorder     Acute kidney disease x1 episode  . Cancer [facility])     Prostate (possibly current)  . Blood transfusion without reported diagnosis   . Anemia   . Pacemaker     Current Nutrition: full breakfast tray at bedside, pt sleeping and no family in room  Food/Nutrition-Related History: noted poor po intake prior to admission with dtr encouraging fluid intake due to dehydration per MD note   Scheduled Medications:  . aspirin  325 mg Oral Daily  . atorvastatin  40 mg Oral q1800  . carvedilol  3.125 mg Oral BID WC  . enoxaparin (LOVENOX) injection  40 mg Subcutaneous Q24H  . insulin aspart  0-5 Units Subcutaneous QHS  . insulin aspart  0-9 Units Subcutaneous TID WC  . isosorbide mononitrate  30 mg Oral Daily  . lisinopril  20 mg Oral Daily    Continuous Medications:  . sodium chloride 75 mL/hr at 10/31/15 2107     Electrolyte/Renal Profile and Glucose Profile:   Recent Labs Lab 10/28/15 1418 10/31/15 0912 11/01/15 0459  NA 136 140 141  K 4.9 4.4 3.8  CL 109 113* 114*  CO2 22 19* 22  BUN 31* 30* 18  CREATININE 1.25* 1.12 0.86  CALCIUM 8.1* 7.9* 7.3*  GLUCOSE 269* 194* 131*    Protein Profile:  Recent Labs Lab 10/28/15 1418 10/31/15 0912  ALBUMIN 2.7* 3.0*    Gastrointestinal Profile: Last BM:    Nutrition-Focused Physical Exam Findings: deferred at this time   Weight Change: 3% wt loss in the last 4 months    Diet Order:  DIET DYS 3 Room service appropriate?: Yes with Assist; Fluid consistency:: Thin  Skin:   reviewed   Height:   Ht Readings from Last 1 Encounters:  10/31/15 5\' 10"  (1.778 m)    Weight:   Wt Readings from Last 1 Encounters:  10/31/15 147 lb (66.679 kg)    Ideal Body Weight:     BMI:  Body mass index is 21.09 kg/(m^2).  Estimated Nutritional Needs:   Kcal:  BEE 1371 kcals (IF 1.0-1.2, AF 1.2) [id number] kcals/d.   Protein:  (1.0-1.2 g/kg) 67-80 g/d  Fluid:  (30-79ml/kg) 2010-2381ml/d  EDUCATION NEEDS:   No education needs identified at this time  [place]. Zenia Resides, [place], [place] (pager) Weekend/On-Call pager [phone])

## 2015-11-01 NOTE — Progress Notes (Signed)
[place] [facility] [place] NAME: Colin Harper    MR#:  [id number]  DATE OF BIRTH:  Nov 23, 1929  SUBJECTIVE:  Patient admitted yesterday with slurred speech No overnight events Patient without complaints this morning-however sleeping easily arousable and seems to have some confusion  REVIEW OF SYSTEMS:  Unable to fully obtain at this time given patient's mental status medical condition  DRUG ALLERGIES:  No Known Allergies  VITALS:  Blood pressure 167/67, pulse 68, temperature 97.6 F (36.4 C), temperature source Oral, resp. rate 20, height 5\' 10"  (1.778 m), weight 66.679 kg (147 lb), SpO2 100 %.  PHYSICAL EXAMINATION:  VITAL SIGNS: Filed Vitals:   11/01/15 0801 11/01/15 1133  BP: 173/65 167/67  Pulse: 70 68  Temp: 97.9 F (36.6 C) 97.6 F (36.4 C)  Resp: 20    GENERAL:82 y.o.male currently in minimal acute distress. Given mental status HEAD: Normocephalic, atraumatic.  EYES: Pupils equal, round, reactive to light. Extraocular muscles intact. No scleral icterus.  MOUTH: Moist mucosal membrane. Dentition intact. No abscess noted.  EAR, NOSE, THROAT: Clear without exudates. No external lesions.  NECK: Supple. No thyromegaly. No nodules. No JVD.  PULMONARY: Clear to ascultation, without wheeze rails or rhonci. No use of accessory muscles, Good respiratory effort. good air entry bilaterally CHEST: Nontender to palpation.  CARDIOVASCULAR: S1 and S2. Regular rate and rhythm. No murmurs, rubs, or gallops. No edema. Pedal pulses 2+ bilaterally.  GASTROINTESTINAL: Soft, nontender, nondistended. No masses. Positive bowel sounds. No hepatosplenomegaly.  MUSCULOSKELETAL: No swelling, clubbing, or edema. Range of motion full in all extremities.  NEUROLOGIC: Unable to fully assess given patient's mental status medical condition and inability to follow commands SKIN: No ulceration, lesions, rashes, or cyanosis. Skin warm and dry. Turgor intact.   PSYCHIATRIC: Unable to fully assess given patient's mental status medical condition with an inability to follow commands     LABORATORY PANEL:   CBC  Recent Labs Lab 10/31/15 0912  WBC 5.6  HGB 9.8*  HCT 30.7*  PLT 171   ------------------------------------------------------------------------------------------------------------------  Chemistries   Recent Labs Lab 10/31/15 0912 11/01/15 0459  NA 140 141  K 4.4 3.8  CL 113* 114*  CO2 19* 22  GLUCOSE 194* 131*  BUN 30* 18  CREATININE 1.12 0.86  CALCIUM 7.9* 7.3*  AST 49*  --   ALT 19  --   ALKPHOS 318*  --   BILITOT 1.1  --    ------------------------------------------------------------------------------------------------------------------  Cardiac Enzymes  Recent Labs Lab 10/31/15 1451  TROPONINI 0.06*   ------------------------------------------------------------------------------------------------------------------  RADIOLOGY:  Dg Chest 1 View  10/31/2015  CLINICAL DATA:  Slurred speech for 3 days, history of prostate cancer EXAM: CHEST 1 VIEW COMPARISON:  04/11/2015, 06/08/2011 FINDINGS: Numerous sclerotic lesions throughout the thoracic skeleton consistent with prostate cancer metastasis as seen on prior bone scan. Mild cardiac enlargement stable. Vascular pattern normal. Lungs clear. There is a 2 lead cardiac pacer with generator over the left thorax. There is no pneumothorax. There is an aortic valve replacement. IMPRESSION: No active disease.  Known sclerotic metastasis. Electronically Signed   By: Skipper Cliche M.D.   On: 10/31/2015 09:46   Ct Head Wo Contrast  10/31/2015  CLINICAL DATA:  Slurred speech for 3 days. Altered mental status. Personal history of metastatic prostate cancer. EXAM: CT HEAD WITHOUT CONTRAST TECHNIQUE: Contiguous axial images were obtained from the base of the skull through the vertex without intravenous contrast. COMPARISON:  None. FINDINGS: No acute infarct, hemorrhage,  or  mass lesion is present. Scattered periventricular and subcortical white matter disease is present bilaterally. The basal ganglia are intact. Insular ribbon is normal. Ventricles are proportionate to the degree of atrophy. No significant extra-axial fluid collection is present. Calvarium is intact. The paranasal sinuses and mastoid air cells are clear. Atherosclerotic calcifications are noted. ASPECTS score = 10/10 [place] Stroke Program Early CT Score Normal score = 10 IMPRESSION: 1. No acute intracranial abnormality. 2. Mild diffuse white matter disease. This likely reflects the sequela of chronic microvascular ischemia. 3. Atherosclerosis Electronically Signed   By: San Morelle M.D.   On: 10/31/2015 09:40   US Carotid Bilateral  10/31/2015  CLINICAL DATA:  Slurred speech. History of hyperlipidemia and diabetes. EXAM: BILATERAL CAROTID DUPLEX ULTRASOUND TECHNIQUE: Pearline Cables scale imaging, color Doppler and duplex ultrasound were performed of bilateral carotid and vertebral arteries in the neck. COMPARISON:  None. FINDINGS: Criteria: Quantification of carotid stenosis is based on velocity parameters that correlate the residual internal carotid diameter with NASCET-based stenosis levels, using the diameter of the distal internal carotid lumen as the denominator for stenosis measurement. The following velocity measurements were obtained: RIGHT ICA:  67/12 cm/sec CCA:  [id number] cm/sec SYSTOLIC ICA/CCA RATIO:  1.1 DIASTOLIC ICA/CCA RATIO:  1.9 ECA:  94 cm/sec LEFT ICA:  83/16 cm/sec CCA:  [id number] cm/sec SYSTOLIC ICA/CCA RATIO:  1.2 DIASTOLIC ICA/CCA RATIO:  2.2 ECA:  120 cm/sec RIGHT CAROTID ARTERY: There is a minimal amount of eccentric mixed echogenic plaque within the mid aspect the right common carotid artery (image 9). There is a moderate amount of eccentric mixed echogenic plaque within the right carotid bulb (representative images 16 and 18), extending to involve the origin and proximal aspects of the right  internal carotid artery (image 26), not resulting in elevated peak systolic velocities within the interrogated course the right internal carotid artery to suggest a hemodynamically significant stenosis. RIGHT VERTEBRAL ARTERY:  Antegrade flow LEFT CAROTID ARTERY: There is a minimal amount of eccentric mixed echogenic plaque within the mid aspect the left common carotid artery (image 41). There is a minimal to moderate amount of eccentric mixed echogenic plaque within the left carotid bulb (representative images 48 and 50), extending to involve the origin and proximal aspects of the left internal carotid artery (image 58), not resulting in elevated peak systolic velocities within the interrogated course of the left internal carotid artery to suggest a hemodynamically significant stenosis. LEFT VERTEBRAL ARTERY:  Antegrade flow IMPRESSION: Minimal to moderate amount of bilateral atherosclerotic plaque, right subjectively greater than left, not resulting in a hemodynamically significant stenosis within either internal carotid artery. Electronically Signed   By: Sandi Mariscal M.D.   On: 10/31/2015 16:17    EKG:   Orders placed or performed during the hospital encounter of 10/31/15  . ED EKG  . ED EKG    ASSESSMENT AND PLAN:   82 year old Caucasian gentleman admitted 10/31/2015 with aphasia and concern for stroke 1. Aphasia: Concern for stroke-neurology input appreciated, will get repeat CT head tomorrow to assess for evolution of stroke MRI unable to be performed given history of pacemaker. Continue aspirin statin therapy-get physical therapy involved  2. Type 2 diabetes: Hold oral agents at insulin sliding scale 3. Essential hypertension: Coreg, lisinopril 4. Venous thromboembolism prophylactic: Lovenox  Disposition: Physical therapy/speech therapy     All the records are reviewed and case discussed with Care Management/Social Workerr. Management plans discussed with the patient, family and they  are in agreement.  CODE STATUS: DO  NOT RESUSCITATE  TOTAL TIME TAKING CARE OF THIS PATIENT: 28 minutes.   POSSIBLE D/C IN 2-3 DAYS, DEPENDING ON CLINICAL CONDITION.   Hower,  Karenann Cai.D on 11/01/2015 at 11:54 AM  Between 7am to 6pm - Pager - [phone]  After 6pm: House Pager: - [phone]  [facility]  Office  [phone]  CC: Primary care physician; Adin Hector, MD

## 2015-11-01 NOTE — Evaluation (Signed)
Occupational Therapy Evaluation Patient Details Name: Colin Harper MRN: [id number] DOB: Mar 19, 1930 Today's Date: 11/01/2015    History of Present Illness Patient is an 82 y.o. male with significant PMH prostate CA metastic to bone. Pt brought to ED by daughter due to slurred speech, speech distrubance, and weakness increasing over past 3 days.   Clinical Impression   Patient was lying in bed when OT arrived. Daughter present at bedside. Patient is very hard of hearing, but was able to answer simple questions and follow commands with repetition and increased volume. Visual cues appear to assist. Daughter able to provide most of prior level history information. States patient lives with daughter in split level home. Until 4 days ago was independent with all self-care ADL tasks, drove, and able to perform light meal prep. Due to prostate cancer, patient has been having difficulty with RLE. Patient would use no AD, cane, or walker for ambulating based on RLE pain and strength. Daughter states patient mostly used the cane. Daughter notes that patient is "at least 50% better than yesterday", specifically around his speech and understanding commands. Patient able to perform bed mobility to sit edge of bed with extra time. BUE WFL for ROM and strength. Basic coordination of BUE appears intact, although patient did have difficulty understanding some of the commands. Able to doff/donn right sock, but unable to reach left foot. Patient states this is new. Educated patient and daughter on use of adaptive equipment to assist with lower body dressing. Needs reinforcement. Able to stand with Min A for balance, and verbal cues to straighten posture. Patient able to demonstrate self-feeding. Patient attempted to write his name, but had difficulty and retried a few times. When asked to explain what he was having difficulty with, patient noted it was "thinking" what to write rather than making the hand work to write it.  Educated patient and daughter that this can be related to the word finding problems patient currently has. Patient required MIN A for performing sit to supine to manage RLE. Patient noted he was fatigued, and fell asleep soon after lying down.     Follow Up Recommendations  SNF    Equipment Recommendations       Recommendations for Other Services Rehab consult     Precautions / Restrictions Precautions Precautions: Fall Restrictions Weight Bearing Restrictions: No      Mobility Bed Mobility Overal bed mobility: Needs Assistance Bed Mobility: Sit to Supine       Sit to supine: Min assist   General bed mobility comments: Min A RLE during sit to supine. Patient able to perform supine to sit with head of bed raised and cuing.  Transfers Overall transfer level: Needs assistance   Transfers: Sit to/from Stand Sit to Stand: Min assist         General transfer comment: Patient had trunk flexion to right side initially when standing. Able to self correct with verbal and visual cues. Also required cues to separate feet for balance.    Balance                                            ADL Overall ADL's : Needs assistance/impaired Eating/Feeding: Set up                   Lower Body Dressing: Moderate assistance   Toilet Transfer: Minimal assistance;Cueing for safety;Cueing  for sequencing             General ADL Comments: Patient has decreased standing balance, and difficulty reaching right lower extremity during dressing tasks.     Vision     Perception     Praxis      Pertinent Vitals/Pain Pain Assessment: No/denies pain     Hand Dominance Right   Extremity/Trunk Assessment Upper Extremity Assessment Upper Extremity Assessment: Overall WFL for tasks assessed   Lower Extremity Assessment Lower Extremity Assessment: Defer to PT evaluation;LLE deficits/detail       Communication Communication Communication: HOH    Cognition Arousal/Alertness: Awake/alert Behavior During Therapy: WFL for tasks assessed/performed Overall Cognitive Status: Impaired/Different from baseline Area of Impairment: Following commands       Following Commands: Follows multi-step commands inconsistently       General Comments: Does better with visual cues   General Comments       Exercises       Shoulder Instructions      Home Living Family/patient expects to be discharged to:: Private residence Living Arrangements: Other (Comment) (daughter) Available Help at Discharge: Family Type of Home: House Home Access: Stairs to enter Technical brewer of Steps: 7  Entrance Stairs-Rails: Can reach both Home Layout: Multi-level;Bed/bath upstairs Alternate Level Stairs-Number of Steps: split level home. Dtr states 7 stairs from garage to landing, 7 more stairs to level with bedrooms Alternate Level Stairs-Rails: Can reach both Bathroom Shower/Tub: Walk-in shower         Home Equipment: Tub bench (daughter states patient doesn't use tub bench)          Prior Functioning/Environment Level of Independence: Independent with assistive device(s)             OT Diagnosis: Generalized weakness   OT Problem List: Impaired balance (sitting and/or standing);Decreased knowledge of use of DME or AE   OT Treatment/Interventions: Self-care/ADL training;Patient/family education;DME and/or AE instruction;Balance training    OT Goals(Current goals can be found in the care plan section) Acute Rehab OT Goals Patient Stated Goal: get better OT Goal Formulation: With patient/family Time For Goal Achievement: 11/15/15 Potential to Achieve Goals: Good  OT Frequency: Min 1X/week   Barriers to D/C: Inaccessible home environment          Co-evaluation              End of Session Equipment Utilized During Treatment: Gait belt  Activity Tolerance: Patient tolerated treatment well Patient left: in bed;with  call bell/phone within reach;with bed alarm set   Time: [id number] OT Time Calculation (min): 43 min Charges:  OT General Charges $OT Visit: 1 Procedure OT Evaluation $OT Eval Low Complexity: 1 Procedure OT Treatments $Self Care/Home Management : 8-22 mins G-Codes:    Braydon Kullman L 2015/11/29, 12:07 PM Amie [place], OTR/L Amie [place], OTR/L

## 2015-11-02 LAB — URINALYSIS COMPLETE WITH MICROSCOPIC (ARMC ONLY)
Bilirubin Urine: NEGATIVE
Glucose, UA: 150 mg/dL — AB
LEUKOCYTES UA: NEGATIVE
Nitrite: NEGATIVE
PH: 5 (ref 5.0–8.0)
PROTEIN: 100 mg/dL — AB
SPECIFIC GRAVITY, URINE: 1.018 (ref 1.005–1.030)

## 2015-11-02 LAB — GLUCOSE, CAPILLARY
GLUCOSE-CAPILLARY: 145 mg/dL — AB (ref 65–99)
GLUCOSE-CAPILLARY: 182 mg/dL — AB (ref 65–99)
GLUCOSE-CAPILLARY: 190 mg/dL — AB (ref 65–99)
Glucose-Capillary: 225 mg/dL — ABNORMAL HIGH (ref 65–99)
Glucose-Capillary: 307 mg/dL — ABNORMAL HIGH (ref 65–99)

## 2015-11-02 MED ORDER — INSULIN ASPART 100 UNIT/ML ~~LOC~~ SOLN
0.0000 [IU] | Freq: Every day | SUBCUTANEOUS | Status: DC
  Administered 2015-11-02: 4 [IU] via SUBCUTANEOUS
  Filled 2015-11-02: qty 3

## 2015-11-02 MED ORDER — INSULIN ASPART 100 UNIT/ML ~~LOC~~ SOLN
0.0000 [IU] | Freq: Three times a day (TID) | SUBCUTANEOUS | Status: DC
  Administered 2015-11-02: 09:00:00 2 [IU] via SUBCUTANEOUS
  Administered 2015-11-02: 3 [IU] via SUBCUTANEOUS
  Administered 2015-11-02: 12:00:00 5 [IU] via SUBCUTANEOUS
  Administered 2015-11-03: 3 [IU] via SUBCUTANEOUS
  Administered 2015-11-03: 8 [IU] via SUBCUTANEOUS
  Filled 2015-11-02: qty 3
  Filled 2015-11-02: qty 4
  Filled 2015-11-02: qty 5
  Filled 2015-11-02: qty 3
  Filled 2015-11-02: qty 2
  Filled 2015-11-02: qty 8

## 2015-11-02 MED ORDER — LISINOPRIL 20 MG PO TABS
40.0000 mg | ORAL_TABLET | Freq: Every day | ORAL | Status: DC
  Administered 2015-11-02 – 2015-11-03 (×2): 40 mg via ORAL
  Filled 2015-11-02 (×2): qty 2

## 2015-11-02 NOTE — Progress Notes (Signed)
[facility] [place] NAME: Colin Harper    MR#:  [id number]  DATE OF BIRTH:  1929/09/16  SUBJECTIVE:  No overnight events Patient more awake this morning currently eating breakfast No complaints  REVIEW OF SYSTEMS:  REVIEW OF SYSTEMS:  CONSTITUTIONAL: Denies fevers, chills, fatigue, weakness.  EYES: Denies blurred vision, double vision, or eye pain.  EARS, NOSE, THROAT: Denies tinnitus, ear pain, hearing loss.  RESPIRATORY: denies cough, shortness of breath, wheezing  CARDIOVASCULAR: Denies chest pain, palpitations, edema.  GASTROINTESTINAL: Denies nausea, vomiting, diarrhea, abdominal pain.  GENITOURINARY: Denies dysuria, hematuria.  ENDOCRINE: Denies nocturia or thyroid problems. HEMATOLOGIC AND LYMPHATIC: Denies easy bruising or bleeding.  SKIN: Denies rash or lesions.  MUSCULOSKELETAL: Denies pain in neck, back, shoulder, knees, hips, or further arthritic symptoms.  NEUROLOGIC: Denies paralysis, paresthesias.  PSYCHIATRIC: Denies anxiety or depressive symptoms. Otherwise full review of systems performed by me is negative.   DRUG ALLERGIES:  No Known Allergies  VITALS:  Blood pressure 165/56, pulse 76, temperature 98.5 F (36.9 C), temperature source Oral, resp. rate 18, height 5\' 10"  (1.778 m), weight 66.679 kg (147 lb), SpO2 99 %.  PHYSICAL EXAMINATION:   VITAL SIGNS: Filed Vitals:   11/02/15 0957 11/02/15 1141  BP: 144/66 165/56  Pulse: 70 76  Temp: 98.4 F (36.9 C) 98.5 F (36.9 C)  Resp: 18 18   GENERAL:82 y.o.male currently in no acute distress.  HEAD: Normocephalic, atraumatic.  EYES: Pupils equal, round, reactive to light. Extraocular muscles intact. No scleral icterus.  MOUTH: Moist mucosal membrane. Dentition intact. No abscess noted.  EAR, NOSE, THROAT: Clear without exudates. No external lesions.  NECK: Supple. No thyromegaly. No nodules. No JVD.  PULMONARY: Clear to ascultation, without wheeze  rails or rhonci. No use of accessory muscles, Good respiratory effort. good air entry bilaterally CHEST: Nontender to palpation.  CARDIOVASCULAR: S1 and S2. Regular rate and rhythm. No murmurs, rubs, or gallops. No edema. Pedal pulses 2+ bilaterally.  GASTROINTESTINAL: Soft, nontender, nondistended. No masses. Positive bowel sounds. No hepatosplenomegaly.  MUSCULOSKELETAL: No swelling, clubbing, or edema. Range of motion full in all extremities.  NEUROLOGIC: Cranial nerves II through XII are intact. No gross focal neurological deficits. Sensation intact. Reflexes intact.  SKIN: No ulceration, lesions, rashes, or cyanosis. Skin warm and dry. Turgor intact.  PSYCHIATRIC: Mood, affect blunted. The patient is awake, alert and oriented person and place. Insight, judgment poor at this time.       LABORATORY PANEL:   CBC  Recent Labs Lab 10/31/15 0912  WBC 5.6  HGB 9.8*  HCT 30.7*  PLT 171   ------------------------------------------------------------------------------------------------------------------  Chemistries   Recent Labs Lab 10/31/15 0912 11/01/15 0459  NA 140 141  K 4.4 3.8  CL 113* 114*  CO2 19* 22  GLUCOSE 194* 131*  BUN 30* 18  CREATININE 1.12 0.86  CALCIUM 7.9* 7.3*  AST 49*  --   ALT 19  --   ALKPHOS 318*  --   BILITOT 1.1  --    ------------------------------------------------------------------------------------------------------------------  Cardiac Enzymes  Recent Labs Lab 10/31/15 1451  TROPONINI 0.06*   ------------------------------------------------------------------------------------------------------------------  RADIOLOGY:  Ct Head Wo Contrast  11/01/2015  CLINICAL DATA:  82 year old male with a history of possible CVA. EXAM: CT HEAD WITHOUT CONTRAST TECHNIQUE: Contiguous axial images were obtained from the base of the skull through the vertex without intravenous contrast. COMPARISON:  10/31/2015 FINDINGS: Unremarkable appearance of the  calvarium without acute fracture or aggressive lesion. Unremarkable  appearance of the scalp soft tissues. Unremarkable appearance of the bilateral orbits. Mastoid air cells are clear. No significant paranasal sinus disease No acute intracranial hemorrhage. No midline shift or mass effect. Gray-white differentiation maintained. Brain volume loss, similar prior. Patchy hypodensities in the bilateral periventricular white matter. Dense calcifications of the intracranial vasculature, anterior and posterior circulation. IMPRESSION: No CT evidence of acute intracranial abnormality. Evidence of chronic microvascular ischemic disease with associated intracranial atherosclerosis. Signed, Dulcy Fanny. Earleen Newport, DO [facility] Electronically Signed   By: Corrie Mckusick D.O.   On: 11/01/2015 13:16   US Carotid Bilateral  10/31/2015  CLINICAL DATA:  Slurred speech. History of hyperlipidemia and diabetes. EXAM: BILATERAL CAROTID DUPLEX ULTRASOUND TECHNIQUE: Pearline Cables scale imaging, color Doppler and duplex ultrasound were performed of bilateral carotid and vertebral arteries in the neck. COMPARISON:  None. FINDINGS: Criteria: Quantification of carotid stenosis is based on velocity parameters that correlate the residual internal carotid diameter with NASCET-based stenosis levels, using the diameter of the distal internal carotid lumen as the denominator for stenosis measurement. The following velocity measurements were obtained: RIGHT ICA:  67/12 cm/sec CCA:  [id number] cm/sec SYSTOLIC ICA/CCA RATIO:  1.1 DIASTOLIC ICA/CCA RATIO:  1.9 ECA:  94 cm/sec LEFT ICA:  83/16 cm/sec CCA:  [id number] cm/sec SYSTOLIC ICA/CCA RATIO:  1.2 DIASTOLIC ICA/CCA RATIO:  2.2 ECA:  120 cm/sec RIGHT CAROTID ARTERY: There is a minimal amount of eccentric mixed echogenic plaque within the mid aspect the right common carotid artery (image 9). There is a moderate amount of eccentric mixed echogenic plaque within the  right carotid bulb (representative images 16 and 18), extending to involve the origin and proximal aspects of the right internal carotid artery (image 26), not resulting in elevated peak systolic velocities within the interrogated course the right internal carotid artery to suggest a hemodynamically significant stenosis. RIGHT VERTEBRAL ARTERY:  Antegrade flow LEFT CAROTID ARTERY: There is a minimal amount of eccentric mixed echogenic plaque within the mid aspect the left common carotid artery (image 41). There is a minimal to moderate amount of eccentric mixed echogenic plaque within the left carotid bulb (representative images 48 and 50), extending to involve the origin and proximal aspects of the left internal carotid artery (image 58), not resulting in elevated peak systolic velocities within the interrogated course of the left internal carotid artery to suggest a hemodynamically significant stenosis. LEFT VERTEBRAL ARTERY:  Antegrade flow IMPRESSION: Minimal to moderate amount of bilateral atherosclerotic plaque, right subjectively greater than left, not resulting in a hemodynamically significant stenosis within either internal carotid artery. Electronically Signed   By: Sandi Mariscal M.D.   On: 10/31/2015 16:17    EKG:   Orders placed or performed during the hospital encounter of 10/31/15  . ED EKG  . ED EKG    ASSESSMENT AND PLAN:   82 year old Caucasian gentleman admitted 10/31/2015 with aphasia and concern for stroke 1. Aphasia: Concern for stroke-neurology input appreciated,  repeat CT unrevealingContinue aspirin statin therapy 2. Type 2 diabetes non-insulin-requiring, poorly controlled: Hyperglycemic today, hemoglobin A1c 8.3, increased sliding scale coverage will likely need long-term insulin 3. Essential hypertension: Coreg, lisinopril-increase lisinopril to achieve better control 4. Venous thromboembolism prophylactic: Lovenox  Disposition: physical therapy recommended home if 24-hour  assistance versus SNF-family request of snf     All the records are reviewed and case discussed with Care Management/Social Workerr. Management plans discussed with the patient, family and they are in agreement.  CODE STATUS: DO NOT RESUSCITATE  TOTAL TIME TAKING CARE OF THIS PATIENT: 31 minutes.   POSSIBLE D/C IN 2-3 DAYS, DEPENDING ON CLINICAL CONDITION.   Hower,  Karenann Cai.D on 11/02/2015 at 11:55 AM  Between 7am to 6pm - Pager - [phone]  After 6pm: House Pager: - [phone]  [facility]  Office  [phone]  CC: Primary care physician; Adin Hector, MD

## 2015-11-02 NOTE — Plan of Care (Signed)
Problem: Safety: Goal: Ability to remain free from injury will improve Outcome: Not Progressing Patient frequently attempting to leave bed in PM.  Impulsive

## 2015-11-02 NOTE — Progress Notes (Signed)
Physical Therapy Treatment Patient Details Name: Colin Harper MRN: [id number] DOB: [birth year] Today's Date: 11/02/2015    History of Present Illness Patient is an 82 y.o. male who presented to ED from home after experiencing progressive aphasia and weakness over the course of 3 days. PMH includes HTN, DM, HLD, and HOH.    PT Comments    Pt's mental status significantly changed from eval yesterday. He has a very hard time with almost all processing activities and needs excessive cuing and demonstration to do even basic acts (especially with L LE exercises).  Pt clearly frustrated with his inability to appropriately respond/find words and has a very hard time with most acts.  Pt with poor awareness with ambulation and needs excessive cuing to stay safe and on task.  Pt has had a decline in functional status and will need short term rehab upon discharge.   Follow Up Recommendations  SNF     Equipment Recommendations       Recommendations for Other Services       Precautions / Restrictions Precautions Precautions: Fall Restrictions Weight Bearing Restrictions: No    Mobility  Bed Mobility Overal bed mobility: Needs Assistance Bed Mobility: Supine to Sit;Sit to Supine     Supine to sit: Min assist Sit to supine: Mod assist   General bed mobility comments: Pt has difficult time processing instructions for getting in/out of bed.   Transfers Overall transfer level: Needs assistance Equipment used: Rolling walker (2 wheeled) Transfers: Sit to/from Stand Sit to Stand: Min guard         General transfer comment: Pt initially does not understand trying to get up despite cuing.  Needs extra encouragement and visual cuing to perform.    Ambulation/Gait Ambulation/Gait assistance: Min assist Ambulation Distance (Feet): 65 Feet Assistive device: Rolling walker (2 wheeled)       General Gait Details: Pt shows very poor awareness with ambualtion and has multiple bouts of  running wheels into stationary obstacles and though he has no LOBs he is not safe and struggles to appropriately use walker or show good safety awareness.    Stairs            Wheelchair Mobility    Modified Rankin (Stroke Patients Only)       Balance                                    Cognition Arousal/Alertness: Suspect due to medications;Lethargic Behavior During Therapy: Flat affect Overall Cognitive Status:  (mental status is considerably different from last PT session)                      Exercises General Exercises - Lower Extremity Ankle Circles/Pumps: Strengthening;20 reps;Both Heel Slides: Strengthening;Both;15 reps (initially unable to do AROM on L even with much cuing/PROM ) Hip ABduction/ADduction: Strengthening;AROM;15 reps;Both (again pt with trouble initiating AROM on L)    General Comments        Pertinent Vitals/Pain Pain Assessment: No/denies pain    Home Living                      Prior Function            PT Goals (current goals can now be found in the care plan section) Progress towards PT goals: Not progressing toward goals - comment (pt with worse motor control, awareness and general function)  Frequency  7X/week    PT Plan Frequency needs to be updated;Discharge plan needs to be updated    Co-evaluation             End of Session Equipment Utilized During Treatment: Gait belt Activity Tolerance: Patient tolerated treatment well Patient left: in bed;with call bell/phone within reach;with bed alarm set     Time: [id number] PT Time Calculation (min) (ACUTE ONLY): 28 min  Charges:  $Gait Training: 8-22 mins $Therapeutic Exercise: 8-22 mins                    G Codes:     Wayne Both, PT, DPT [phone]  Kreg Shropshire 11/02/2015, 4:26 PM

## 2015-11-02 NOTE — Clinical Social Work Placement (Signed)
   CLINICAL SOCIAL WORK PLACEMENT  NOTE  Date:  11/02/2015  Patient Details  Name: Colin Harper MRN: [id number] Date of Birth: March 02, 1930  Clinical Social Work is seeking post-discharge placement for this patient at the [place] level of care (*CSW will initial, date and re-position this form in  chart as items are completed):  Yes   Patient/family provided with [facility] Work Department's list of facilities offering this level of care within the geographic area requested by the patient (or if unable, by the patient's family).  Yes   Patient/family informed of their freedom to choose among providers that offer the needed level of care, that participate in Medicare, Medicaid or managed care program needed by the patient, have an available bed and are willing to accept the patient.  Yes   Patient/family informed of [facility]'s ownership interest in [facility] and [facility], as well as of the fact that they are under no obligation to receive care at these facilities.  PASRR submitted to EDS on 11/02/15     PASRR number received on 11/02/15     Existing PASRR number confirmed on       FL2 transmitted to all facilities in geographic area requested by pt/family on 11/02/15     FL2 transmitted to all facilities within larger geographic area on       Patient informed that his/her managed care company has contracts with or will negotiate with certain facilities, including the following:            Patient/family informed of bed offers received.  Patient chooses bed at       Physician recommends and patient chooses bed at      Patient to be transferred to   on  .  Patient to be transferred to facility by       Patient family notified on   of transfer.  Name of family member notified:        PHYSICIAN       Additional Comment:    _______________________________________________ Loralyn Freshwater, LCSW 11/02/2015, 1:47 PM

## 2015-11-02 NOTE — NC FL2 (Signed)
[place] LEVEL OF CARE SCREENING TOOL     IDENTIFICATION  Patient Name: Colin Harper Birthdate: 1930/07/25 Sex: male Admission Date (Current Location): 10/31/2015  [place] and [place] Number:  Engineering geologist and Address:  [facility], [address], [place], [place] [address]      Provider Number: [id number]  Attending Physician Name and Address:  Lytle Butte, MD  Relative Name and Phone Number:       Current Level of Care: Hospital Recommended Level of Care: [place] Prior Approval Number:    Date Approved/Denied:   PASRR Number:  ([id number])  Discharge Plan: SNF    Current Diagnoses: Patient Active Problem List   Diagnosis Date Noted  . Slurred speech 10/31/2015  . Prostate cancer metastatic to bone ([place]) 04/15/2015    Hypertension   . Diabetes mellitus without complication ([place])   . Hyperlipidemia   . Renal disorder     Acute kidney disease x1 episode  . Cancer [facility])     Prostate (possibly current)  . Blood transfusion without reported diagnosis   . Anemia   . Pacemaker          Orientation RESPIRATION BLADDER Height & Weight     Self, Place  Normal Continent Weight: 147 lb (66.679 kg) Height:  5\' 10"  (177.8 cm)  BEHAVIORAL SYMPTOMS/MOOD NEUROLOGICAL BOWEL NUTRITION STATUS   (none )  (none ) Continent Diet (Diet: DYS 3 )  AMBULATORY STATUS COMMUNICATION OF NEEDS Skin   Extensive Assist Verbally Normal                       Personal Care Assistance Level of Assistance  Bathing, Feeding, Dressing Bathing Assistance: Limited assistance Feeding assistance: Independent Dressing Assistance: Limited assistance     Functional Limitations Info  Sight, Hearing, Speech Sight Info: Adequate Hearing Info: Impaired Speech Info: Adequate    SPECIAL CARE FACTORS FREQUENCY  PT (By licensed PT)  OT      PT Frequency:  (5)    OT 5           Contractures      Additional Factors Info  Code Status, Allergies, Insulin Sliding Scale Code Status Info:  (DNR ) Allergies Info:  (No Known Allergies. )   Insulin Sliding Scale Info:  (NovoLOG insulin Injections )       Current Medications (11/02/2015):  This is the current hospital active medication list Current Facility-Administered Medications  Medication Dose Route Frequency Provider Last Rate Last Dose  . aspirin tablet 325 mg  325 mg Oral Daily Demetrios Loll, MD   325 mg at 11/02/15 1151  . atorvastatin (LIPITOR) tablet 40 mg  40 mg Oral q1800 Demetrios Loll, MD   40 mg at 11/01/15 1707  . carvedilol (COREG) tablet 3.125 mg  3.125 mg Oral BID WC Demetrios Loll, MD   3.125 mg at 11/02/15 1150  . enoxaparin (LOVENOX) injection 40 mg  40 mg Subcutaneous Q24H Demetrios Loll, MD   40 mg at 11/01/15 1954  . feeding supplement (ENSURE ENLIVE) (ENSURE ENLIVE) liquid 237 mL  237 mL Oral BID BM Lytle Butte, MD   237 mL at 11/02/15 1152  . insulin aspart (novoLOG) injection 0-15 Units  0-15 Units Subcutaneous TID WC Lytle Butte, MD   5 Units at 11/02/15 1151  . insulin aspart (novoLOG) injection 0-5 Units  0-5 Units Subcutaneous QHS Lytle Butte, MD      .  isosorbide mononitrate (IMDUR) 24 hr tablet 30 mg  30 mg Oral Daily Demetrios Loll, MD   30 mg at 11/02/15 1150  . lisinopril (PRINIVIL,ZESTRIL) tablet 40 mg  40 mg Oral Daily Lytle Butte, MD   40 mg at 11/02/15 1150  . senna-docusate (Senokot-S) tablet 1 tablet  1 tablet Oral QHS PRN Demetrios Loll, MD         Discharge Medications: Please see discharge summary for a list of discharge medications.  Relevant Imaging Results:  Relevant Lab Results:   Additional Information  (SSN: [id number])  Loralyn Freshwater, LCSW

## 2015-11-02 NOTE — Clinical Social Work Note (Signed)
Clinical Social Work Assessment  Patient Details  Name: Colin Harper MRN: [id number] Date of Birth: 27-Aug-1929  Date of referral:  11/02/15               Reason for consult:  Facility Placement                Permission sought to share information with:  Facility Art therapist granted to share information::  Yes, Verbal Permission Granted  Name::      Colin Harper (Daughter/POA) )  Agency::   Colin Harper (Daughter/POA))  Relationship::   (Daughter/POA)  Contact Information:   (C [phone] H [phone])  Housing/Transportation Living arrangements for the past 2 months:  [employer] of Information:  Adult Children Patient Interpreter Needed:  None Criminal Activity/Legal Involvement Pertinent to Current Situation/Hospitalization:  No - Comment as needed Significant Relationships:  Adult Children Lives with:  Self Do you feel safe going back to the place where you live?  No Need for family participation in patient care:  Yes (Comment)  Care giving concerns: Reason for consult:  Facility Placement/ 24 hr supervision                 Social Worker assessment / plan:  Holiday representative (CSW) met with patient at bedside. Patient is alert and oriented at times. Patient was lying in bed. Daughter/POA Colin Harper was present at bedside. CSW introduced self and explained role of CSW department. According to patient daughter, patient lives alone in a single family home. Daughter reports that she is not able to care for patient and would like patient to be placed at a SNF. CSW provided patient daughter with a list of SNF.   FL2 completed and faxed out. CSW will continue to follow and assist as needed.   Employment status:  Retired Advertising copywriter PT Recommendations:  [place], Home with [place], [place] / Referral to community resources:  [place]  Patient/Family's Response to care:  Patient and family has agreed to [facility] Placement.  Patient/Family's Understanding of and Emotional Response to Diagnosis, Current Treatment, and Prognosis: Patient and daughter was pleasant throughout assessment and thanked CSW for visit.   Emotional Assessment Appearance:  Appears stated age, Well-Groomed Attitude/Demeanor/Rapport:    Affect (typically observed):  Calm, Quiet, Stable Orientation:  Oriented to Self, Oriented to Place, Oriented to  Time, Oriented to Situation (At times) Alcohol / Substance use:    Psych involvement (Current and /or in the community):  No (Comment)  Discharge Needs  Concerns to be addressed:  Cognitive Concerns Readmission within the last 30 days:    Current discharge risk:  Lives alone (PT requires 24 hour supervision) Barriers to Discharge:  Continued Medical Work up   [facility]	[place], LCSW 11/02/2015, 4:09 PM

## 2015-11-02 NOTE — Progress Notes (Signed)
Daughter is concerned regarding patient status - stating that he is more lethargic today. Patient is stating "I just do not feel right today." CBG normal, VSS. No complaints of pain. Oriented to person and place. MD notified. Will continue to monitor. Madlyn Frankel, RN

## 2015-11-03 LAB — BASIC METABOLIC PANEL
Anion gap: 7 (ref 5–15)
BUN: 17 mg/dL (ref 6–20)
CALCIUM: 8 mg/dL — AB (ref 8.9–10.3)
CO2: 21 mmol/L — ABNORMAL LOW (ref 22–32)
CREATININE: 0.78 mg/dL (ref 0.61–1.24)
Chloride: 108 mmol/L (ref 101–111)
GFR calc Af Amer: >60 mL/min (ref >60)
Glucose, Bld: 166 mg/dL — ABNORMAL HIGH (ref 65–99)
Potassium: 4.3 mmol/L (ref 3.5–5.1)
SODIUM: 136 mmol/L (ref 135–145)

## 2015-11-03 LAB — GLUCOSE, CAPILLARY
GLUCOSE-CAPILLARY: 170 mg/dL — AB (ref 65–99)
Glucose-Capillary: 270 mg/dL — ABNORMAL HIGH (ref 65–99)

## 2015-11-03 MED ORDER — ACETAMINOPHEN 325 MG PO TABS
650.0000 mg | ORAL_TABLET | Freq: Four times a day (QID) | ORAL | Status: DC | PRN
  Administered 2015-11-03: 11:00:00 650 mg via ORAL
  Filled 2015-11-03: qty 2

## 2015-11-03 MED ORDER — ENSURE ENLIVE PO LIQD: 237.0000 mL | Freq: Two times a day (BID) | ORAL | Status: AC

## 2015-11-03 MED ORDER — ATORVASTATIN CALCIUM 40 MG PO TABS: 40.0000 mg | ORAL_TABLET | Freq: Every day | ORAL | Status: AC

## 2015-11-03 MED ORDER — TRAMADOL HCL 50 MG PO TABS: 50.0000 mg | ORAL_TABLET | Freq: Four times a day (QID) | ORAL | Status: AC | PRN

## 2015-11-03 MED ORDER — LISINOPRIL 40 MG PO TABS: 40.0000 mg | ORAL_TABLET | Freq: Every day | ORAL | Status: AC

## 2015-11-03 NOTE — Progress Notes (Signed)
   11/01/15 1207  OT Time Calculation  OT Start Time (ACUTE ONLY) 1030  OT Stop Time (ACUTE ONLY) 1113  OT Time Calculation (min) 43 min  OT G-codes **NOT FOR INPATIENT CLASS**  Functional Assessment Tool Used clinical judgement  Functional Limitation Self care  Self Care Current Status [id number]) CJ  Self Care Goal Status [id number]) CI  OT General Charges  $OT Visit 1 Procedure  OT Evaluation  $OT Eval Low Complexity 1 Procedure  OT Treatments  $Self Care/Home Management  8-22 mins    Late entry G-codes entered after review of initial assessment/documentation completed by Amie [place], OT and after review/discussion of notes and patient performance with Chrys Racer, OT and Harrel Carina, OT.  Reyes Ivan. Owens Shark, PT, DPT, NCS 11/03/2015, 1:59 PM [phone]

## 2015-11-03 NOTE — Progress Notes (Signed)
Speech Language Pathology Treatment: Dysphagia  Patient Details Name: Colin Harper MRN: [id number] DOB: October 13, 1929 Today's Date: 11/03/2015 Time: 1010-1040 SLP Time Calculation (min) (ACUTE ONLY): 30 min  Assessment / Plan / Recommendation Clinical Impression  Pt appears to adequately tolerate trials of thin liquids via straw then broken down, soft solids w/ no overt s/s of aspiration w/ trials given. No decline in respiratory status noted; no change or wet vocal quality post trials given. Oral phase wfl w/ trials; min. Increased oral phase time w/ bread/jelly trials and pt cleared appropriately. Timely bolus management was noted w/ all liquid trials. Pt can feed self w/ min. support sec. to overall weakness and fatigue status. Pt is HOH. Pt appears at reduced risk for aspiration following general aspiration precautions. Rec. Continue a Dsy. 3 w/ thin liquids diet; aspiration precautions and support w/ feeding/eating at meals; meds in Puree if any trouble swallowing w/ liquids; NO large straws; tray setup at meals. Of note, pt tolerated 2 pills at a time w/ water w/ NSG during assessment.    HPI HPI: Patient is an 82 y.o. male with significant PMH prostate CA metastic to bone. Pt brought to ED by daughter due to slurred speech, speech distrubance, and weakness increasing over past 3 days.[facility] staff state that pt's speech is clear now; pt is able to verbally communicate wants/needs appropriately and indicated to [place] that he had a headache and needed something for it. NSG reported pt has been tolerating his current Dys. 3 diet; meds w/ liquids much of the time.       SLP Plan  All goals met     Recommendations  Diet recommendations: Dysphagia 3 (mechanical soft);Thin liquid Liquids provided via: Cup;Straw Medication Administration: Whole meds with liquid (or in Puree as Delma Post.,) Supervision: Staff to assist with self feeding;Intermittent supervision to cue for compensatory strategies Compensations:  Minimize environmental distractions;Slow rate;Small sips/bites;Follow solids with liquid Postural Changes and/or Swallow Maneuvers: Seated upright 90 degrees;Upright 30-60 min after meal             General recommendations:  (Dietician f/u) Oral Care Recommendations: Oral care BID;Staff/trained caregiver to provide oral care Follow up Recommendations: Skilled Nursing facility (TBD) Plan: All goals met     [place], [place], CCC-SLP  Watson,Katherine 11/03/2015, 11:05 AM

## 2015-11-03 NOTE — Progress Notes (Signed)
Patient discharged to [facility] per MD order. Report called to [place] Fant[place] at facility. EMS called for transportation.

## 2015-11-03 NOTE — Progress Notes (Signed)
Checked to see if pre-authorization would be needed for non-emergent EMS transport. Per [employer] benefits obtained online through [employer], patient has a [employer] Medicare Advantage PPO policy.  Medicare PPO plans do not require pre-auth for non-emergent ground transports using service codes A0426 or A0428.   

## 2015-11-03 NOTE — Clinical Social Work Placement (Signed)
   CLINICAL SOCIAL WORK PLACEMENT  NOTE  Date:  11/03/2015  Patient Details  Name: Colin Harper MRN: [id number] Date of Birth: 1930-03-23  Clinical Social Work is seeking post-discharge placement for this patient at the [place] level of care (*CSW will initial, date and re-position this form in  chart as items are completed):  Yes   Patient/family provided with [facility] Work Department's list of facilities offering this level of care within the geographic area requested by the patient (or if unable, by the patient's family).  Yes   Patient/family informed of their freedom to choose among providers that offer the needed level of care, that participate in Medicare, Medicaid or managed care program needed by the patient, have an available bed and are willing to accept the patient.  Yes   Patient/family informed of [facility]'s ownership interest in [facility] and [facility], as well as of the fact that they are under no obligation to receive care at these facilities.  PASRR submitted to EDS on 11/02/15     PASRR number received on 11/02/15     Existing PASRR number confirmed on       FL2 transmitted to all facilities in geographic area requested by pt/family on 11/02/15     FL2 transmitted to all facilities within larger geographic area on       Patient informed that his/her managed care company has contracts with or will negotiate with certain facilities, including the following:        Yes   Patient/family informed of bed offers received.  Patient chooses bed at [facility]     Physician recommends and patient chooses bed at  [facility])    Patient to be transferred to [facility] [place] on 11/03/15.  Patient to be transferred to facility by [facility]     Patient family notified on 11/03/15 of transfer.  Name of family member notified:  Sharyn Lull, daughter     PHYSICIAN       Additional Comment:     _______________________________________________ Darden Dates, LCSW 11/03/2015, 1:44 PM

## 2015-11-03 NOTE — Plan of Care (Signed)
Problem: Education: Goal: Knowledge of disease or condition will improve Outcome: Progressing Pt progressing, speech no longer slurred. Speech clear and making needs known. However, does have issues with memory impairment because pt is impulsive and needs frequent redirecting.

## 2015-11-03 NOTE — Discharge Summary (Signed)
[facility] [place] NAME: Colin Harper    MR#:  [id number]  DATE OF BIRTH:  Apr 17, 1930  DATE OF ADMISSION:  10/31/2015 ADMITTING PHYSICIAN: Demetrios Loll, MD  DATE OF DISCHARGE: No discharge date for patient encounter.  PRIMARY CARE PHYSICIAN: Tama High III, MD    ADMISSION DIAGNOSIS:  Slurred speech [R47.81] Speech disturbance [R47.9] Weakness [R53.1] Elevated troponin I level [R79.89]  DISCHARGE DIAGNOSIS:  cva unspecified Essential hypertension  SECONDARY DIAGNOSIS:   Past Medical History  Diagnosis Date  . Hypertension   . Diabetes mellitus without complication ([place])   . Hyperlipidemia   . Renal disorder     Acute kidney disease x1 episode  . Cancer [facility])     Prostate (possibly current)  . Blood transfusion without reported diagnosis   . Anemia   . Pacemaker     HOSPITAL COURSE:  Colin Harper  is a 82 y.o. male admitted 10/31/2015 with chief complaint of slurred speech. Please see H&P performed by Dr. Bridgett Larsson for further information. Given cluster of symptoms concern for stroke original head CT obtained without abnormality. Patient was admitted to the hospital and placed on telemetry and echocardiogram prior ultrasound performed insertion for further etiologies of stroke. Neurology consult at who aided in risk factor modification. Given history of pacemaker patient was unable to undergo MRI repeat CAT scan of the head performed 48 hours after initial without changes. Symptoms gradually have improved patient still not at baseline. Evaluated by physical therapy who recommended SNF placement to which the patient and family have agreed.  DISCHARGE CONDITIONS:   Stable/improved  CONSULTS OBTAINED:  Treatment Team:  Alexis Goodell, MD Lytle Butte, MD  DRUG ALLERGIES:  No Known Allergies  DISCHARGE MEDICATIONS:   Current Discharge Medication List    START taking these medications   Details  atorvastatin (LIPITOR)  40 MG tablet Take 1 tablet (40 mg total) by mouth daily at 6 PM. Qty: 30 tablet, Refills: 0    feeding supplement, ENSURE ENLIVE, (ENSURE ENLIVE) LIQD Take 237 mLs by mouth 2 (two) times daily between meals. Qty: 237 mL, Refills: 12      CONTINUE these medications which have CHANGED   Details  lisinopril (PRINIVIL,ZESTRIL) 40 MG tablet Take 1 tablet (40 mg total) by mouth daily. Qty: 30 tablet, Refills: 0    traMADol (ULTRAM) 50 MG tablet Take 1 tablet (50 mg total) by mouth every 6 (six) hours as needed. Qty: 30 tablet, Refills: 0      CONTINUE these medications which have NOT CHANGED   Details  aspirin 81 MG tablet Take 81 mg by mouth daily.    calcium carbonate (TUMS - DOSED IN MG ELEMENTAL CALCIUM) 500 MG chewable tablet Chew 1 tablet by mouth 3 (three) times daily.    carvedilol (COREG) 3.125 MG tablet Take 3.125 mg by mouth 2 (two) times daily with a meal.    Associated Diagnoses: Prostate cancer (HCC)    glipiZIDE (GLUCOTROL XL) 10 MG 24 hr tablet Take 10 mg by mouth daily.    Associated Diagnoses: Prostate cancer ([place])    isosorbide mononitrate (IMDUR) 60 MG 24 hr tablet Take 30 mg by mouth daily.    Associated Diagnoses: Prostate cancer ([place])    psyllium (METAMUCIL) 58.6 % powder Take 1 packet by mouth daily as needed.    vitamin B-12 (CYANOCOBALAMIN) 1000 MCG tablet Take 1,000 mcg by mouth daily.    Vitamin D, Cholecalciferol, 1000 UNITS TABS  Take 1 tablet by mouth 2 (two) times daily.          DISCHARGE INSTRUCTIONS:    DIET:  Dysphagia 3 (mechanical soft);Thin liquid, carb modified   DISCHARGE CONDITION:  Stable  ACTIVITY:  Activity as tolerated  OXYGEN:  Home Oxygen: No.   Oxygen Delivery: room air  DISCHARGE LOCATION:  nursing home   If you experience worsening of your admission symptoms, develop shortness of breath, life threatening emergency, suicidal or homicidal thoughts you must seek medical attention immediately by calling 911 or  calling your MD immediately  if symptoms less severe.  You Must read complete instructions/literature along with all the possible adverse reactions/side effects for all the Medicines you take and that have been prescribed to you. Take any new Medicines after you have completely understood and accpet all the possible adverse reactions/side effects.   Please note  You were cared for by a hospitalist during your hospital stay. If you have any questions about your discharge medications or the care you received while you were in the hospital after you are discharged, you can call the unit and asked to speak with the hospitalist on call if the hospitalist that took care of you is not available. Once you are discharged, your primary care physician will handle any further medical issues. Please note that NO REFILLS for any discharge medications will be authorized once you are discharged, as it is imperative that you return to your primary care physician (or establish a relationship with a primary care physician if you do not have one) for your aftercare needs so that they can reassess your need for medications and monitor your lab values.    On the day of Discharge:   VITAL SIGNS:  Blood pressure 163/55, pulse 83, temperature 97.8 F (36.6 C), temperature source Oral, resp. rate 20, height 5\' 10"  (1.778 m), weight 147 lb (66.679 kg), SpO2 100 %.  I/O:   Intake/Output Summary (Last 24 hours) at 11/03/15 1158 Last data filed at 11/03/15 0900  Gross per 24 hour  Intake    240 ml  Output    400 ml  Net   -160 ml    PHYSICAL EXAMINATION:  GENERAL:  82 y.o.-year-old patient lying in the bed with no acute distress.  EYES: Pupils equal, round, reactive to light and accommodation. No scleral icterus. Extraocular muscles intact.  HEENT: Head atraumatic, normocephalic. Oropharynx and nasopharynx clear.  NECK:  Supple, no jugular venous distention. No thyroid enlargement, no tenderness.  LUNGS: Normal  breath sounds bilaterally, no wheezing, rales,rhonchi or crepitation. No use of accessory muscles of respiration.  CARDIOVASCULAR: S1, S2 normal. No murmurs, rubs, or gallops.  ABDOMEN: Soft, non-tender, non-distended. Bowel sounds present. No organomegaly or mass.  EXTREMITIES: No pedal edema, cyanosis, or clubbing.  NEUROLOGIC: Cranial nerves II through XII are intact. Muscle strength 5/5 in all extremities. Sensation intact. Gait not checked.  PSYCHIATRIC: The patient is alert and oriented x 3.  SKIN: No obvious rash, lesion, or ulcer.   DATA REVIEW:   CBC  Recent Labs Lab 10/31/15 0912  WBC 5.6  HGB 9.8*  HCT 30.7*  PLT 171    Chemistries   Recent Labs Lab 10/31/15 0912  11/03/15 0429  NA 140  < > 136  K 4.4  < > 4.3  CL 113*  < > 108  CO2 19*  < > 21*  GLUCOSE 194*  < > 166*  BUN 30*  < > 17  CREATININE 1.12  < >  0.78  CALCIUM 7.9*  < > 8.0*  AST 49*  --   --   ALT 19  --   --   ALKPHOS 318*  --   --   BILITOT 1.1  --   --   < > = values in this interval not displayed.  Cardiac Enzymes  Recent Labs Lab 10/31/15 1451  TROPONINI 0.06*    Microbiology Results  Results for orders placed or performed in visit on 02/01/13  Clostridium Difficile by PCR     Status: None   Collection Time: 02/01/13 12:11 PM  Result Value Ref Range Status   Micro Text Report   Final       COMMENT                   NEGATIVE-CLOS.DIFFICILE TOXIN NOT DETECTED BY PCR   ANTIBIOTIC                                                      Stool culture     Status: None   Collection Time: 02/01/13 12:11 PM  Result Value Ref Range Status   Micro Text Report   Final       COMMENT                   NO SALMONELLA OR SHIGELLA ISOLATED IN 48 HOURS   COMMENT                   NO CAMPYLOBACTER ANTIGEN DETECTED   COMMENT                   NO PATHOGENIC E.COLI DETECTED   ANTIBIOTIC                                                      Urine culture     Status: None   Collection Time:  02/01/13 12:11 PM  Result Value Ref Range Status   Micro Text Report   Final       SOURCE: CLEAN CATCH    ORGANISM 1                30,000 CFU/ML ENTEROCOCCUS FAECALIS   ORGANISM 2                2000 CFU COAGULASE NEGATIVE STAPHYLOCOCCUS   COMMENT                   -   ANTIBIOTIC                    ORG#1     AMPICILLIN                    S         CIPROFLOXACIN                 S         LEVOFLOXACIN                  S         LINEZOLID  S         NITROFURANTOIN                S         TETRACYCLINE                  S             RADIOLOGY:  Ct Head Wo Contrast  11/01/2015  CLINICAL DATA:  82 year old male with a history of possible CVA. EXAM: CT HEAD WITHOUT CONTRAST TECHNIQUE: Contiguous axial images were obtained from the base of the skull through the vertex without intravenous contrast. COMPARISON:  10/31/2015 FINDINGS: Unremarkable appearance of the calvarium without acute fracture or aggressive lesion. Unremarkable appearance of the scalp soft tissues. Unremarkable appearance of the bilateral orbits. Mastoid air cells are clear. No significant paranasal sinus disease No acute intracranial hemorrhage. No midline shift or mass effect. Gray-white differentiation maintained. Brain volume loss, similar prior. Patchy hypodensities in the bilateral periventricular white matter. Dense calcifications of the intracranial vasculature, anterior and posterior circulation. IMPRESSION: No CT evidence of acute intracranial abnormality. Evidence of chronic microvascular ischemic disease with associated intracranial atherosclerosis. Signed, Dulcy Fanny. Earleen Newport, DO [facility] Radiology Electronically Signed   By: Corrie Mckusick D.O.   On: 11/01/2015 13:16     Management plans discussed with the patient, family and they are in agreement.  CODE STATUS:     Code Status Orders        Start     Ordered   10/31/15 1219  Do not attempt  resuscitation (DNR)   Continuous    Question Answer Comment  In the event of cardiac or respiratory ARREST Do not call a "code blue"   In the event of cardiac or respiratory ARREST Do not perform Intubation, CPR, defibrillation or ACLS   In the event of cardiac or respiratory ARREST Use medication by any route, position, wound care, and other measures to relive pain and suffering. May use oxygen, suction and manual treatment of airway obstruction as needed for comfort.      10/31/15 1218    Code Status History    Date Active Date Inactive Code Status Order ID Comments User Context   This patient has a current code status but no historical code status.      TOTAL TIME TAKING CARE OF THIS PATIENT: 28 minutes.    Hower,  Karenann Cai.D on 11/03/2015 at 11:58 AM  Between 7am to 6pm - Pager - [phone]  After 6pm go to [email or web address] - password [email or web address] [facility]  [facility]  Office  [phone]  CC: Primary care physician; Adin Hector, MD

## 2015-11-03 NOTE — Progress Notes (Signed)
Inpatient Diabetes Program Recommendations  AACE/ADA: New Consensus Statement on Inpatient Glycemic Control (2015)  Target Ranges:  Prepandial:   less than 140 mg/dL      [facility] postprandial:   less than 180 mg/dL (1-2 hours)      Critically ill patients:  140 - 180 mg/dL   Review of Glycemic Control:  Results for Colin Harper, Colin Harper (MRN [id number]) as of 11/03/2015 10:40  Ref. Range 11/02/2015 07:47 11/02/2015 10:13 11/02/2015 11:10 11/02/2015 16:20 11/02/2015 21:23 11/03/2015 07:37  Glucose-Capillary Latest Ref Range: 65-99 mg/dL 145 (H) 182 (H) 225 (H) 190 (H) 307 (H) 170 (H)   Diabetes history: Type 2 diabetes Outpatient Diabetes medications: Glucotrol XL 10 mg daily Current orders for Inpatient glycemic control:  Novolog moderate tid with meals and HS  Inpatient Diabetes Program Recommendations:    May consider adding Novolog meal coverage 3 units tid with meals (Hold if patient eats less than 50%).  Thanks, Adah Perl, RN, BC-ADM Inpatient Diabetes Coordinator Pager [phone] (8a-5p)

## 2015-11-03 NOTE — Progress Notes (Signed)
Occupational Therapy Treatment Patient Details Name: Colin Harper MRN: [id number] DOB: [birth year] Today's Date: 11/03/2015    History of present illness Patient is an 82 y.o. male who presented to ED from home after experiencing progressive aphasia and weakness over the course of 3 days. PMH includes HTN, DM, HLD, and HOH.   OT comments  This patient is an 82 y.o. male who is HOH and is very lethargic today. Pt. presents with limited attention for basic self-grooming tasks. Pt. required hand-over-hand assist to complete the hand-to-face patterns. Pt. was unable to attend to and participate in basic reacher training today. Pt. continues to require skilled OT services for improving ADL tasks and A/E use.  Follow Up Recommendations  SNF    Equipment Recommendations       Recommendations for Other Services Rehab consult    Precautions / Restrictions         Mobility Bed Mobility                  Transfers                      Balance                                   ADL Overall ADL's : Needs assistance/impaired                                       General ADL Comments: Pt. was lethargic during treatment session with limited attention to tasks. Pt. performed light grooming tasks including combing hair with hand-over hand assist, visual demonstration, and constant verbal cues to complete. Attempted training for basic reacher use in  the retrieval of items in preparation for use during ADL and IADLs. Pt. was unable to perform basic reacher use due to lethargy, and decreased attention.      Vision                     Perception     Praxis      Cognition                             Extremity/Trunk Assessment               Exercises     Shoulder Instructions       General Comments      Pertinent Vitals/ Pain       Pain Assessment: 0-10 (unable to describe to NSG) Pain Location:  headache Pain Descriptors / Indicators: Constant Pain Intervention(s): RN gave pain meds during session  Home Living Family/patient expects to be discharged to:: Private residence Living Arrangements: Children;Other (Comment) (Daughter) Available Help at Discharge: Family;Available PRN/intermittently Type of Home: House Home Access: Stairs to enter [employer] of Steps: 7  Entrance Stairs-Rails: Can reach both Home Layout: Multi-level;Bed/bath upstairs Alternate Level Stairs-Number of Steps: Obtained from previous note: split level, 7 stairs from garage to landing; 7 stairs from landing to bedrooms Alternate Level Stairs-Rails: Can reach both Bathroom Shower/Tub: Walk-in shower         Home Equipment: Tub bench          Prior Functioning/Environment Level of Independence: Independent with assistive device(s)  Frequency Min 1X/week     Progress Toward Goals  OT Goals(current goals can now be found in the care plan section)        Plan      Co-evaluation                 End of Session     Activity Tolerance Patient tolerated treatment well   Patient Left in bed;with call bell/phone within reach   Nurse Communication      Functional Assessment Tool Used: Clinical judgement based upon pt. current functional level   Time: 1105-1120 OT Time Calculation (min): 15 min  Charges: OT G-codes **NOT FOR INPATIENT CLASS** Functional Assessment Tool Used: Clinical judgement based upon pt. current functional level OT General Charges $OT Visit: 1 Procedure OT Treatments $Self Care/Home Management : 8-22 mins   Harrel Carina, MS, OTR/L  Harrel Carina 11/03/2015, 11:52 AM

## 2015-11-03 NOTE — Progress Notes (Signed)
Physical Therapy Treatment Patient Details Name: Colin Harper MRN: [id number] DOB: 05-19-1930 Today's Date: 11/03/2015    History of Present Illness Patient is an 82 y.o. male who presented to ED from home after experiencing progressive aphasia and weakness over the course of 3 days. PMH includes HTN, DM, HLD, and HOH.    PT Comments    Treatment attempted 2x this morning. Initially pt had just awoken and family notes pt is too groggy and wishes PT to return. Second attempt, pt with MD. Pt now has discharge orders to skilled nursing facility.   Follow Up Recommendations        Equipment Recommendations       Recommendations for Other Services       Precautions / Restrictions      Mobility  Bed Mobility                  Transfers                    Ambulation/Gait                 Stairs            Wheelchair Mobility    Modified Rankin (Stroke Patients Only)       Balance                                    Cognition                            Exercises      General Comments        Pertinent Vitals/Pain Pain Assessment: 0-10 (unable to describe to NSG) Pain Location: headache Pain Descriptors / Indicators: Constant Pain Intervention(s): RN gave pain meds during session    Home Living Family/patient expects to be discharged to:: Private residence Living Arrangements: Children;Other (Comment) (Daughter) Available Help at Discharge: Family;Available PRN/intermittently Type of Home: House Home Access: Stairs to enter Entrance Stairs-Rails: Can reach both Home Layout: Multi-level;Bed/bath upstairs Home Equipment: Tub bench      Prior Function Level of Independence: Independent with assistive device(s)          PT Goals (current goals can now be found in the care plan section)      Frequency       PT Plan      Co-evaluation             End of Session           Time:  -      Charges:                       G CodesCharlaine Dalton, PTA 11/03/2015, 12:14 PM

## 2015-11-03 NOTE — Clinical Social Work Note (Signed)
Pt is ready for discharge today to [facility]. Pt's daughter is in agreement with discharge plan. Pt has been sleepy most of the morning, and he would answer questions, however fall back to sleep quickly. Per RN, pt has been up most of the night. Facility has received discharge information and is ready to admit pt. RN called report. [facility] will provide transportation. CSW provided supportive counseling around pt's transition to [facility]. CSW is signing off as no further needs identified.   Darden Dates, MSW, LCSW  Clinical Social Worker  [phone]

## 2015-11-25 ENCOUNTER — Inpatient Hospital Stay: Payer: Medicare Other | Attending: Family Medicine

## 2015-11-25 ENCOUNTER — Inpatient Hospital Stay: Payer: Medicare Other

## 2015-11-25 ENCOUNTER — Telehealth: Payer: Self-pay | Admitting: *Deleted

## 2015-11-25 ENCOUNTER — Other Ambulatory Visit: Payer: Self-pay | Admitting: Family Medicine

## 2015-11-25 DIAGNOSIS — C61 Malignant neoplasm of prostate: Secondary | ICD-10-CM

## 2015-11-25 DIAGNOSIS — C7951 Secondary malignant neoplasm of bone: Secondary | ICD-10-CM | POA: Insufficient documentation

## 2015-11-25 DIAGNOSIS — Z79899 Other long term (current) drug therapy: Secondary | ICD-10-CM | POA: Diagnosis not present

## 2015-11-25 DIAGNOSIS — Z79818 Long term (current) use of other agents affecting estrogen receptors and estrogen levels: Secondary | ICD-10-CM | POA: Insufficient documentation

## 2015-11-25 DIAGNOSIS — D509 Iron deficiency anemia, unspecified: Secondary | ICD-10-CM

## 2015-11-25 DIAGNOSIS — Z9079 Acquired absence of other genital organ(s): Secondary | ICD-10-CM | POA: Diagnosis not present

## 2015-11-25 LAB — CBC WITH DIFFERENTIAL/PLATELET
BASOS PCT: 1 %
Basophils Absolute: 0 10*3/uL (ref 0–0.1)
Eosinophils Absolute: 0.2 10*3/uL (ref 0–0.7)
Eosinophils Relative: 3 %
HEMATOCRIT: 31.2 % — AB (ref 40.0–52.0)
HEMOGLOBIN: 10.2 g/dL — AB (ref 13.0–18.0)
LYMPHS ABS: 0.7 10*3/uL — AB (ref 1.0–3.6)
Lymphocytes Relative: 12 %
MCH: 27 pg (ref 26.0–34.0)
MCHC: 32.8 g/dL (ref 32.0–36.0)
MCV: 82.2 fL (ref 80.0–100.0)
MONO ABS: 0.6 10*3/uL (ref 0.2–1.0)
MONOS PCT: 11 %
NEUTROS ABS: 4.1 10*3/uL (ref 1.4–6.5)
NEUTROS PCT: 73 %
Platelets: 128 10*3/uL — ABNORMAL LOW (ref 150–440)
RBC: 3.79 MIL/uL — ABNORMAL LOW (ref 4.40–5.90)
RDW: 24.5 % — AB (ref 11.5–14.5)
WBC: 5.6 10*3/uL (ref 3.8–10.6)

## 2015-11-25 LAB — COMPREHENSIVE METABOLIC PANEL
ALBUMIN: 2.3 g/dL — AB (ref 3.5–5.0)
ALK PHOS: 583 U/L — AB (ref 38–126)
ALT: 53 U/L (ref 17–63)
ANION GAP: 3 — AB (ref 5–15)
AST: 126 U/L — ABNORMAL HIGH (ref 15–41)
BILIRUBIN TOTAL: 1.1 mg/dL (ref 0.3–1.2)
BUN: 35 mg/dL — ABNORMAL HIGH (ref 6–20)
CALCIUM: 7.6 mg/dL — AB (ref 8.9–10.3)
CO2: 24 mmol/L (ref 22–32)
Chloride: 108 mmol/L (ref 101–111)
Creatinine, Ser: 1.13 mg/dL (ref 0.61–1.24)
GFR, EST NON AFRICAN AMERICAN: 58 mL/min — AB (ref >60)
Glucose, Bld: 206 mg/dL — ABNORMAL HIGH (ref 65–99)
POTASSIUM: 5.2 mmol/L — AB (ref 3.5–5.1)
Sodium: 135 mmol/L (ref 135–145)
TOTAL PROTEIN: 6 g/dL — AB (ref 6.5–8.1)

## 2015-11-25 MED ORDER — DEGARELIX ACETATE 80 MG ~~LOC~~ SOLR
80.0000 mg | Freq: Once | SUBCUTANEOUS | Status: AC
  Administered 2015-11-25: 80 mg via SUBCUTANEOUS
  Filled 2015-11-25: qty 4

## 2015-11-25 MED ORDER — DENOSUMAB 120 MG/1.7ML ~~LOC~~ SOLN
120.0000 mg | Freq: Once | SUBCUTANEOUS | Status: AC
  Administered 2015-11-25: 120 mg via SUBCUTANEOUS
  Filled 2015-11-25: qty 1.7

## 2015-11-25 NOTE — Telephone Encounter (Signed)
Called to report that because pt is in a facility ([facility]) they had to get order from their doctor (Dr Lovie Macadamia) before going in to see pt. He has denied that patient needs the hospice home so they will not be going to see him. I asked that she please notify the family so that they can speak with Dr Lovie Macadamia

## 2015-11-26 ENCOUNTER — Other Ambulatory Visit: Payer: Self-pay | Admitting: Family Medicine

## 2015-11-27 ENCOUNTER — Telehealth: Payer: Self-pay | Admitting: *Deleted

## 2015-11-27 NOTE — Telephone Encounter (Signed)
OK per Dr Grayland Ormond

## 2015-11-27 NOTE — Telephone Encounter (Signed)
Daughter has decided to take patient home from nursing facility and wants hospice services. Asking if the previous order can be used and if you agree to be patient's MD

## 2015-12-11 ENCOUNTER — Telehealth: Payer: Self-pay | Admitting: *Deleted

## 2015-12-11 ENCOUNTER — Encounter: Payer: Self-pay | Admitting: *Deleted

## 2015-12-11 NOTE — Telephone Encounter (Signed)
Per Dr Grayland Ormond Stop ASA, not other orders at this time. I called and left msg for Virgie

## 2015-12-11 NOTE — Telephone Encounter (Signed)
Caled to report that when he had BM last evening, there was dark blood/ fleshy looking material passed, then this morning, he had a nosebleed with dark red blood. Asking if he should discontinue low dose ASA and if anything else needs to be done

## 2015-12-22 ENCOUNTER — Ambulatory Visit (HOSPITAL_COMMUNITY): Admission: RE | Admit: 2015-12-22 | Payer: Medicare Other | Source: Ambulatory Visit

## 2015-12-23 ENCOUNTER — Inpatient Hospital Stay: Payer: Medicare Other | Admitting: Family Medicine

## 2015-12-23 ENCOUNTER — Ambulatory Visit: Payer: Medicare Other

## 2015-12-23 ENCOUNTER — Inpatient Hospital Stay: Payer: Medicare Other

## 2016-01-06 ENCOUNTER — Telehealth: Payer: Self-pay | Admitting: *Deleted

## 2016-01-22 NOTE — Telephone Encounter (Signed)
Called to report that patient died Jan 12, 2016 @ 7:49 AM

## 2016-01-22 DEATH — deceased

## 2016-12-03 ENCOUNTER — Other Ambulatory Visit: Payer: Self-pay | Admitting: Nurse Practitioner

## 2017-08-16 IMAGING — MR MR ABDOMEN WO/W CM
7 of 16 series · 18 of 48 positions shown · IV contrast (multihance)
Comparison: Multiple exams, including 04/10/2015

CLINICAL DATA: Prostate cancer 18 years ago. Diffuse osseous
metastatic disease on recent bone scan. Pancreatic lesion on recent
CT.

EXAM:
MRI ABDOMEN WITHOUT AND WITH CONTRAST (INCLUDING MRCP)
TECHNIQUE: Multiplanar multisequence MR imaging of the abdomen was performed
both before and after the administration of intravenous contrast.
Heavily T2-weighted images of the biliary and pancreatic ducts were
obtained, and three-dimensional MRCP images were rendered by post
processing.
CONTRAST:  15mL MULTIHANCE GADOBENATE DIMEGLUMINE 529 MG/ML IV SOLN

[Series 4: T2 · axial · 5.0mm · 0.78mm/px · z∈[-121,+139]mm · 3 of 53 slices shown (1 of 2)]
[im 1/53]
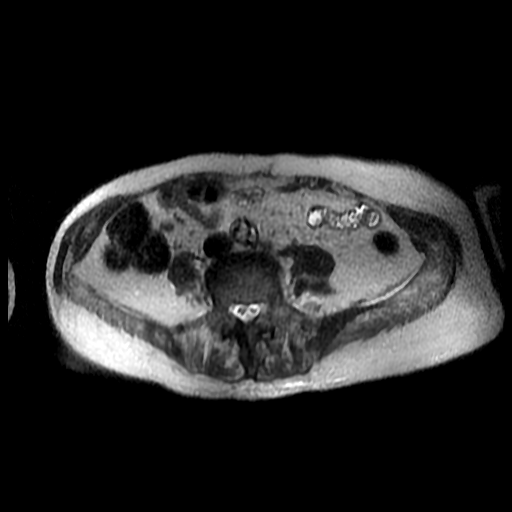
[im 27/53]
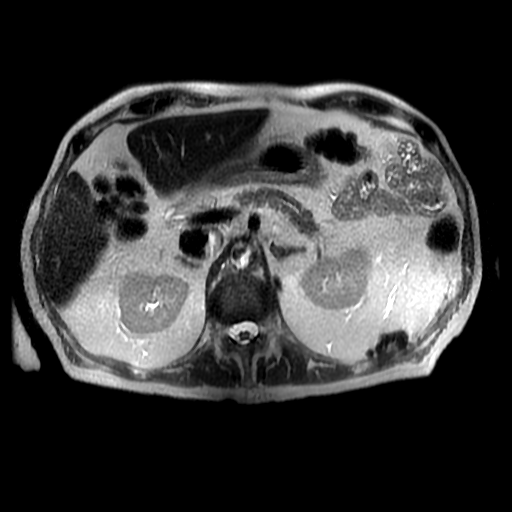
[im 53/53]
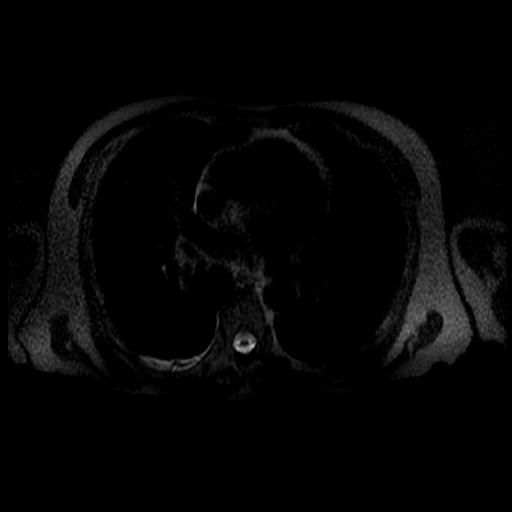

[Series 5: T2 · coronal · 5.0mm · 0.78mm/px · 3 of 48 slices shown (2 of 2)]
[im 1/48]
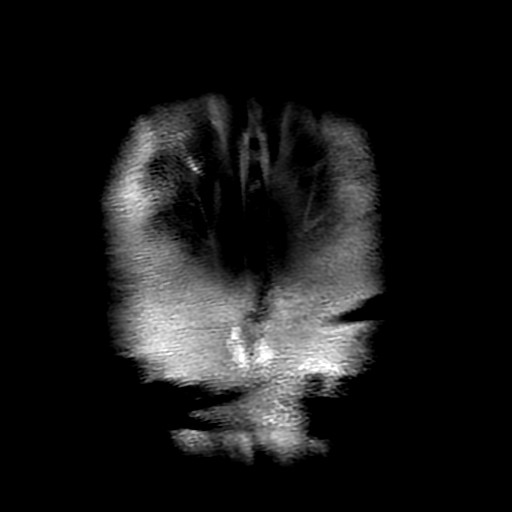
[im 24/48]
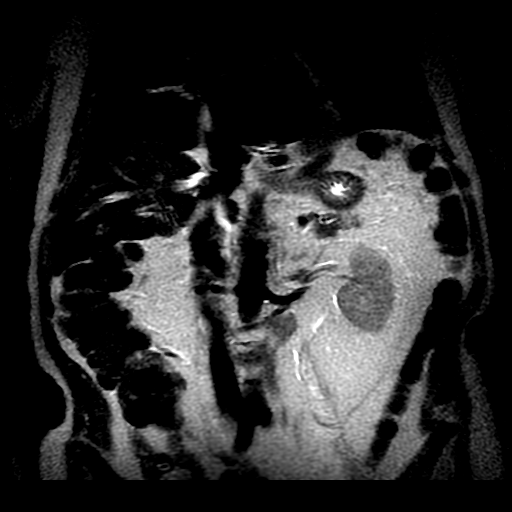
[im 48/48]
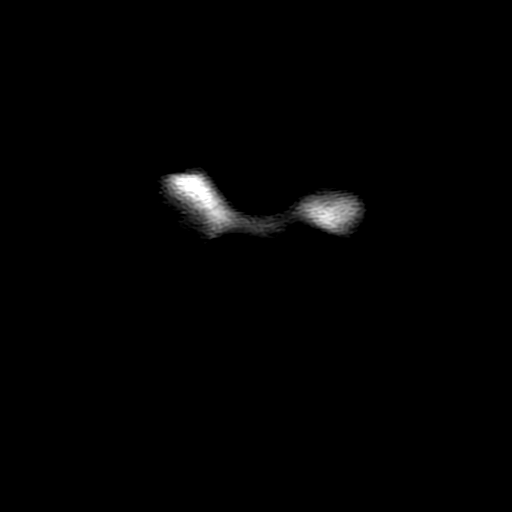

[Series 6: T2 fat-sat · axial · 5.0mm · 0.78mm/px · z∈[-107,+118]mm · 2 of 46 slices shown]
[im 1/46]
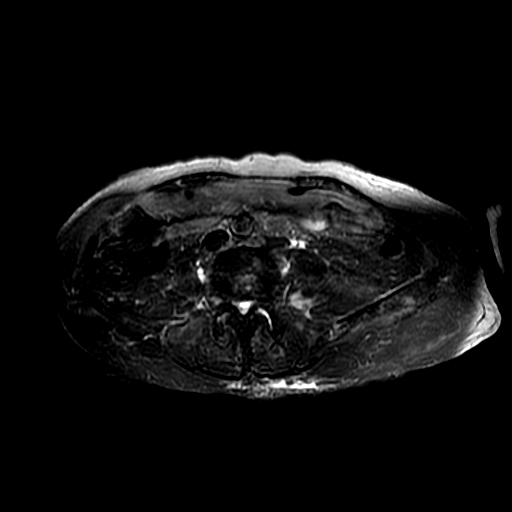
[im 46/46]
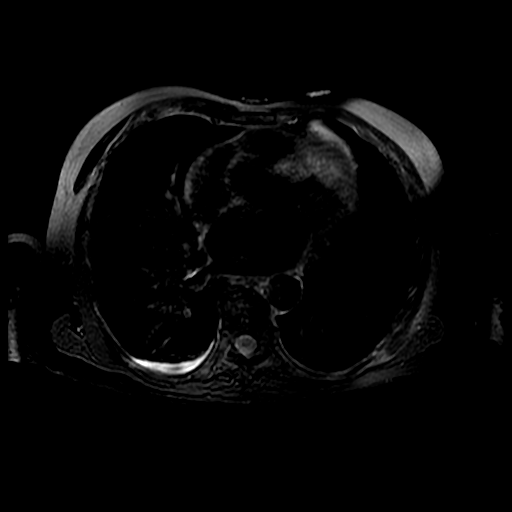

[Series 11: DWI b500 · axial · 6.0mm · 1.56mm/px · z∈[-92,+135]mm · 2 of 54 slices shown]
[im 1/54]
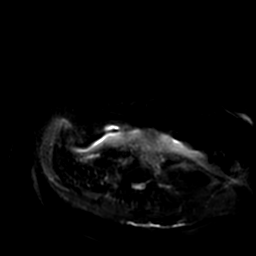
[im 54/54]
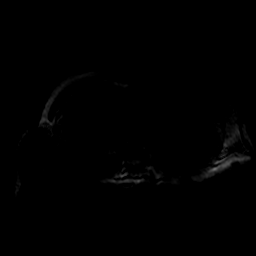

[Series 14: ax dualecho · axial · 5.0mm · 0.80mm/px · z∈[-95,+135]mm · 4 of 94 slices shown]
[im 1/94]
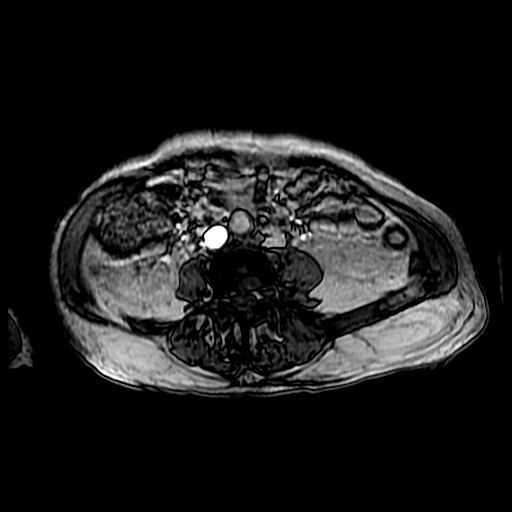
[im 32/94]
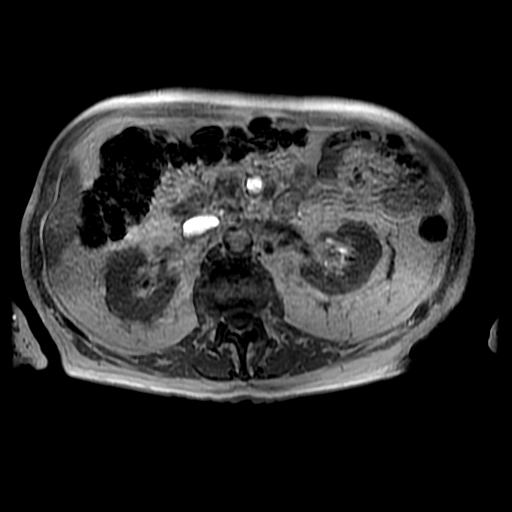
[im 63/94]
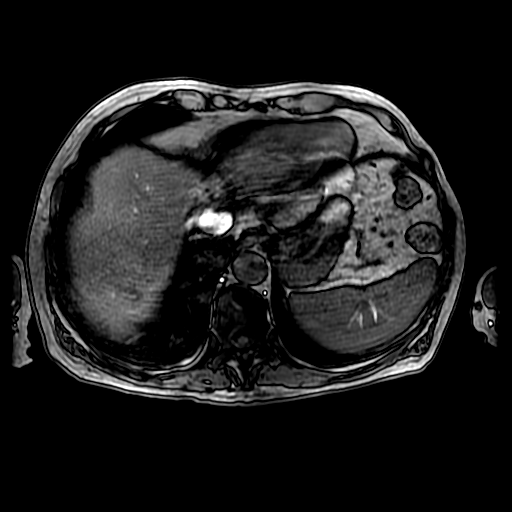
[im 94/94]
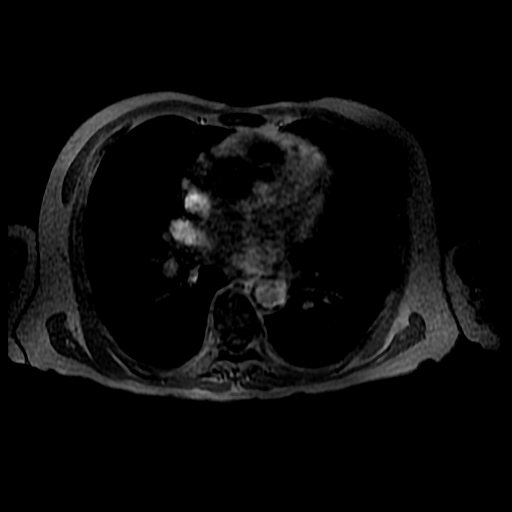

[Series 16: T1 dynamic post-contrast · coronal · 5.0mm · 0.78mm/px · 3 of 88 slices shown]
[im 1/88]
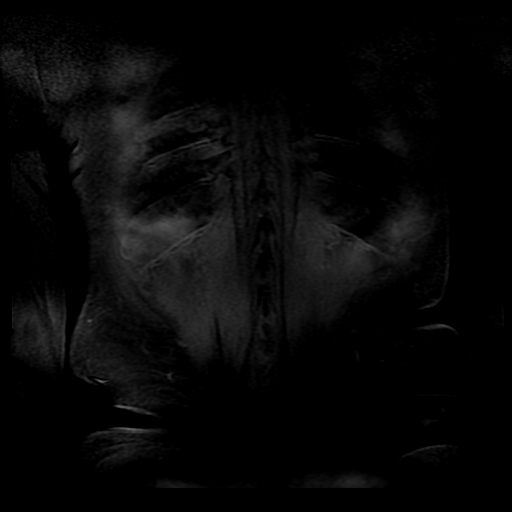
[im 44/88]
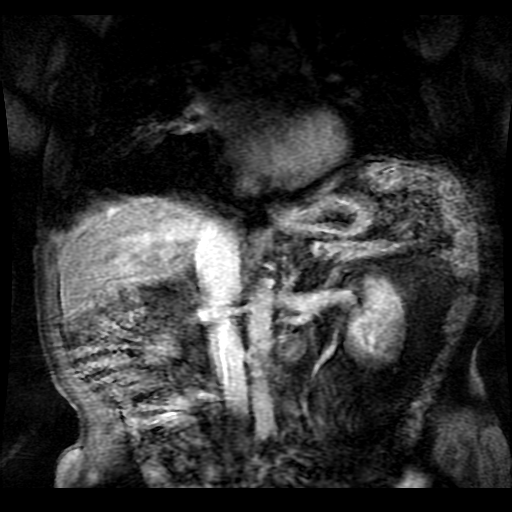
[im 88/88]
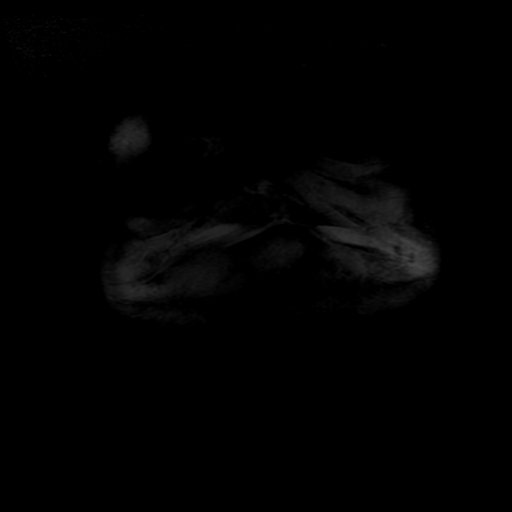

[Series 1500: T1 dynamic · axial · 5.0mm · 0.78mm/px · 1 of 88 slices shown]
[im 1/88]
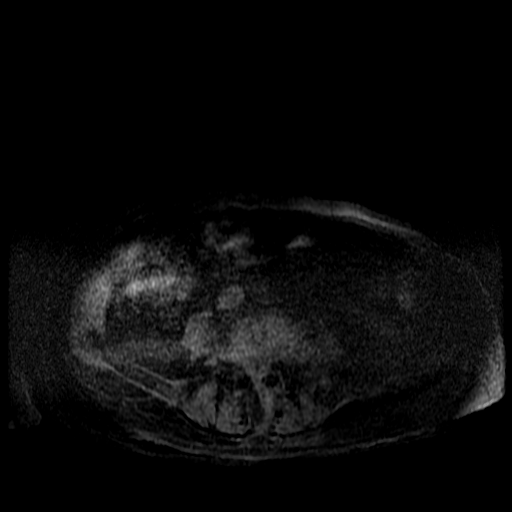

[18 of 48 positions shown; findings below may reference images not displayed]

FINDINGS: Despite efforts by the technologist and patient, motion artifact is
present on today's exam and could not be eliminated. This reduces
exam sensitivity and specificity.

Lower chest:  Trace right pleural effusion.  Mild cardiomegaly.

Hepatobiliary: Mildly T2 hyperintense lesion in the dome of segment
8 is obscured in indistinct on the post-contrast images due to
motion artifact, but measures 2.6 cm in diameter on the T2 weighted
images. This lesion is thought to enhance peripherally and some of
the peripheral enhancement is present in the arterial phase. No
intrahepatic or extrahepatic biliary dilatation observed. The
gallbladder is severely full of gallstones, to the point where
porcelain gallbladder cannot be excluded.

Pancreas: My impression is that there is likely a partial pancreas
divisum with a 3.4 by 0.9 by 1.5 cm cluster of cystic lesions along
the portion of the pancreatic duct draining to the minor papilla.
This could alternatively represent beading of this duct due to
chronic pancreatitis, although I am skeptical that it was present on
the prior exam from 02/01/2013 which was noncontrast. The remainder
of the dorsal pancreatic duct has a normal caliber. No obvious
enhancement along the cystic lesions but the motion artifact on the
postcontrast series is severe.

Spleen: Unremarkable

Adrenals/Urinary Tract: Bilateral renal cysts.

Stomach/Bowel: Unremarkable

Vascular/Lymphatic: Splenorenal shunt. Aortic atherosclerosis.
Enlarged left periaortic lymph nodes, 1.4 cm in short axis on image
40 series 4.

Other: No supplemental non-categorized findings.

Musculoskeletal: Heterogeneous marrow compatible with extensive
osseous metastatic disease. Lumbar spondylosis and degenerative disc
disease.
IMPRESSION: 1. The clustered cystic lesion of concern in the pancreatic head
region appears to be along a branch of the dorsal pancreatic duct
extending to the minor papilla, as product of a partial pancreas
divisum. Although conceivably from chronic focal pancreatitis, the
appearance raises concern for intraductal papillary mucinous
neoplasm measuring 3.4 cm by 0.9 by 1.5 cm. Likely a branch duct
type IPMN. Follow up MR imaging in 6 months time is recommended.
This recommendation follows ACR consensus guidelines: Managing
Incidental Findings on Abdominal CT: White Paper of the ACR
Incidental Findings Committee. [HOSPITAL] 8131;[DATE]
2. Enhancing nonspecific lesion in the dome of segment 8 of the
liver, 2.6 cm in diameter. Specificity reduced by severe motion
artifact. I am doubtful that this is a hemangioma and is not thought
to represent a cyst. This might be a tricky biopsy given the
proximity to the diaphragm. This could also be reassessed at the
time of follow up pancreatic MRI.
3. Trace right pleural effusion.
4. Splenorenal shunt.
5. Enlarged left periaortic lymph node at 1.4 cm in short axis,
stable.
6. Extensive osseous metastatic disease.
7. The gallbladder is severely full of gallstones, to the point
where porcelain gallbladder cannot be readily excluded.
